# Patient Record
Sex: Female | Born: 1970 | Race: Black or African American | Hispanic: No | Marital: Married | Smoking: Former smoker
Health system: Southern US, Community
[De-identification: ages and names within clinical notes are randomized; demographics above are authoritative.]

## PROBLEM LIST (undated history)

## (undated) DIAGNOSIS — K219 Gastro-esophageal reflux disease without esophagitis: Secondary | ICD-10-CM

## (undated) DIAGNOSIS — K5900 Constipation, unspecified: Secondary | ICD-10-CM

## (undated) DIAGNOSIS — D649 Anemia, unspecified: Secondary | ICD-10-CM

## (undated) DIAGNOSIS — F419 Anxiety disorder, unspecified: Secondary | ICD-10-CM

## (undated) DIAGNOSIS — T7840XA Allergy, unspecified, initial encounter: Secondary | ICD-10-CM

## (undated) DIAGNOSIS — M1990 Unspecified osteoarthritis, unspecified site: Secondary | ICD-10-CM

## (undated) DIAGNOSIS — I10 Essential (primary) hypertension: Secondary | ICD-10-CM

## (undated) HISTORY — DX: Anemia, unspecified: D64.9

## (undated) HISTORY — DX: Constipation, unspecified: K59.00

## (undated) HISTORY — PX: WISDOM TOOTH EXTRACTION: SHX21

## (undated) HISTORY — DX: Gastro-esophageal reflux disease without esophagitis: K21.9

---

## 1997-07-22 ENCOUNTER — Emergency Department (HOSPITAL_COMMUNITY): Admission: EM | Admit: 1997-07-22 | Discharge: 1997-07-22 | Payer: Self-pay | Admitting: Emergency Medicine

## 1999-01-14 ENCOUNTER — Other Ambulatory Visit: Admission: RE | Admit: 1999-01-14 | Discharge: 1999-01-14 | Payer: Self-pay | Admitting: Obstetrics and Gynecology

## 1999-04-03 ENCOUNTER — Emergency Department (HOSPITAL_COMMUNITY): Admission: EM | Admit: 1999-04-03 | Discharge: 1999-04-03 | Payer: Self-pay | Admitting: Emergency Medicine

## 1999-10-11 ENCOUNTER — Inpatient Hospital Stay (HOSPITAL_COMMUNITY): Admission: AD | Admit: 1999-10-11 | Discharge: 1999-10-11 | Payer: Self-pay | Admitting: Obstetrics and Gynecology

## 1999-11-29 ENCOUNTER — Emergency Department (HOSPITAL_COMMUNITY): Admission: EM | Admit: 1999-11-29 | Discharge: 1999-11-29 | Payer: Self-pay | Admitting: Emergency Medicine

## 2000-04-09 ENCOUNTER — Inpatient Hospital Stay (HOSPITAL_COMMUNITY): Admission: AD | Admit: 2000-04-09 | Discharge: 2000-04-09 | Payer: Self-pay | Admitting: Obstetrics and Gynecology

## 2000-04-09 ENCOUNTER — Encounter: Payer: Self-pay | Admitting: Obstetrics and Gynecology

## 2000-04-27 ENCOUNTER — Encounter (INDEPENDENT_AMBULATORY_CARE_PROVIDER_SITE_OTHER): Payer: Self-pay

## 2000-04-27 ENCOUNTER — Ambulatory Visit (HOSPITAL_COMMUNITY): Admission: RE | Admit: 2000-04-27 | Discharge: 2000-04-27 | Payer: Self-pay | Admitting: Obstetrics and Gynecology

## 2000-07-05 ENCOUNTER — Emergency Department (HOSPITAL_COMMUNITY): Admission: EM | Admit: 2000-07-05 | Discharge: 2000-07-05 | Payer: Self-pay | Admitting: Emergency Medicine

## 2000-07-05 ENCOUNTER — Encounter: Payer: Self-pay | Admitting: Emergency Medicine

## 2000-07-10 ENCOUNTER — Emergency Department (HOSPITAL_COMMUNITY): Admission: EM | Admit: 2000-07-10 | Discharge: 2000-07-10 | Payer: Self-pay | Admitting: Emergency Medicine

## 2000-08-10 ENCOUNTER — Ambulatory Visit (HOSPITAL_COMMUNITY): Admission: RE | Admit: 2000-08-10 | Discharge: 2000-08-10 | Payer: Self-pay | Admitting: Obstetrics and Gynecology

## 2000-08-10 ENCOUNTER — Encounter (INDEPENDENT_AMBULATORY_CARE_PROVIDER_SITE_OTHER): Payer: Self-pay | Admitting: Specialist

## 2000-08-10 ENCOUNTER — Encounter: Payer: Self-pay | Admitting: Obstetrics and Gynecology

## 2001-06-04 ENCOUNTER — Other Ambulatory Visit: Admission: RE | Admit: 2001-06-04 | Discharge: 2001-06-04 | Payer: Self-pay | Admitting: Obstetrics and Gynecology

## 2001-06-25 ENCOUNTER — Observation Stay (HOSPITAL_COMMUNITY): Admission: AD | Admit: 2001-06-25 | Discharge: 2001-06-25 | Payer: Self-pay | Admitting: Obstetrics and Gynecology

## 2001-07-31 ENCOUNTER — Inpatient Hospital Stay (HOSPITAL_COMMUNITY): Admission: AD | Admit: 2001-07-31 | Discharge: 2001-07-31 | Payer: Self-pay | Admitting: Obstetrics and Gynecology

## 2001-08-01 ENCOUNTER — Ambulatory Visit (HOSPITAL_COMMUNITY): Admission: RE | Admit: 2001-08-01 | Discharge: 2001-08-01 | Payer: Self-pay | Admitting: Obstetrics and Gynecology

## 2001-08-01 ENCOUNTER — Encounter: Payer: Self-pay | Admitting: Obstetrics and Gynecology

## 2001-08-12 ENCOUNTER — Inpatient Hospital Stay (HOSPITAL_COMMUNITY): Admission: AD | Admit: 2001-08-12 | Discharge: 2001-08-15 | Payer: Self-pay | Admitting: Obstetrics and Gynecology

## 2001-08-13 ENCOUNTER — Encounter: Payer: Self-pay | Admitting: Obstetrics and Gynecology

## 2001-08-22 ENCOUNTER — Inpatient Hospital Stay (HOSPITAL_COMMUNITY): Admission: AD | Admit: 2001-08-22 | Discharge: 2001-08-22 | Payer: Self-pay | Admitting: Obstetrics and Gynecology

## 2001-10-20 ENCOUNTER — Encounter: Payer: Self-pay | Admitting: Obstetrics and Gynecology

## 2001-10-20 ENCOUNTER — Inpatient Hospital Stay (HOSPITAL_COMMUNITY): Admission: AD | Admit: 2001-10-20 | Discharge: 2001-10-20 | Payer: Self-pay | Admitting: Obstetrics and Gynecology

## 2001-11-09 ENCOUNTER — Inpatient Hospital Stay (HOSPITAL_COMMUNITY): Admission: AD | Admit: 2001-11-09 | Discharge: 2001-11-09 | Payer: Self-pay | Admitting: Obstetrics and Gynecology

## 2001-12-12 ENCOUNTER — Observation Stay (HOSPITAL_COMMUNITY): Admission: AD | Admit: 2001-12-12 | Discharge: 2001-12-13 | Payer: Self-pay | Admitting: Obstetrics and Gynecology

## 2001-12-12 ENCOUNTER — Encounter: Payer: Self-pay | Admitting: Obstetrics and Gynecology

## 2001-12-14 ENCOUNTER — Inpatient Hospital Stay (HOSPITAL_COMMUNITY): Admission: AD | Admit: 2001-12-14 | Discharge: 2001-12-14 | Payer: Self-pay | Admitting: Obstetrics and Gynecology

## 2001-12-23 ENCOUNTER — Inpatient Hospital Stay (HOSPITAL_COMMUNITY): Admission: AD | Admit: 2001-12-23 | Discharge: 2001-12-25 | Payer: Self-pay | Admitting: Obstetrics and Gynecology

## 2002-03-28 ENCOUNTER — Emergency Department (HOSPITAL_COMMUNITY): Admission: EM | Admit: 2002-03-28 | Discharge: 2002-03-28 | Payer: Self-pay | Admitting: Emergency Medicine

## 2002-05-28 ENCOUNTER — Inpatient Hospital Stay (HOSPITAL_COMMUNITY): Admission: AD | Admit: 2002-05-28 | Discharge: 2002-05-28 | Payer: Self-pay | Admitting: Obstetrics and Gynecology

## 2002-10-15 ENCOUNTER — Encounter: Payer: Self-pay | Admitting: Emergency Medicine

## 2002-10-15 ENCOUNTER — Emergency Department (HOSPITAL_COMMUNITY): Admission: EM | Admit: 2002-10-15 | Discharge: 2002-10-15 | Payer: Self-pay | Admitting: Emergency Medicine

## 2003-12-06 ENCOUNTER — Emergency Department (HOSPITAL_COMMUNITY): Admission: EM | Admit: 2003-12-06 | Discharge: 2003-12-07 | Payer: Self-pay | Admitting: Emergency Medicine

## 2004-03-23 ENCOUNTER — Emergency Department (HOSPITAL_COMMUNITY): Admission: EM | Admit: 2004-03-23 | Discharge: 2004-03-23 | Payer: Self-pay | Admitting: Emergency Medicine

## 2004-05-18 ENCOUNTER — Inpatient Hospital Stay (HOSPITAL_COMMUNITY): Admission: AD | Admit: 2004-05-18 | Discharge: 2004-05-18 | Payer: Self-pay | Admitting: *Deleted

## 2004-06-16 ENCOUNTER — Ambulatory Visit: Payer: Self-pay | Admitting: Family Medicine

## 2004-06-27 ENCOUNTER — Ambulatory Visit (HOSPITAL_COMMUNITY): Admission: RE | Admit: 2004-06-27 | Discharge: 2004-06-27 | Payer: Self-pay | Admitting: *Deleted

## 2004-07-14 ENCOUNTER — Ambulatory Visit: Payer: Self-pay | Admitting: Family Medicine

## 2004-08-01 ENCOUNTER — Ambulatory Visit: Payer: Self-pay | Admitting: Obstetrics and Gynecology

## 2005-05-13 ENCOUNTER — Emergency Department (HOSPITAL_COMMUNITY): Admission: EM | Admit: 2005-05-13 | Discharge: 2005-05-13 | Payer: Self-pay | Admitting: Emergency Medicine

## 2005-12-21 ENCOUNTER — Emergency Department (HOSPITAL_COMMUNITY): Admission: EM | Admit: 2005-12-21 | Discharge: 2005-12-21 | Payer: Self-pay | Admitting: Emergency Medicine

## 2008-04-08 ENCOUNTER — Emergency Department (HOSPITAL_COMMUNITY): Admission: EM | Admit: 2008-04-08 | Discharge: 2008-04-08 | Payer: Self-pay | Admitting: Emergency Medicine

## 2008-09-15 ENCOUNTER — Emergency Department (HOSPITAL_COMMUNITY): Admission: EM | Admit: 2008-09-15 | Discharge: 2008-09-15 | Payer: Self-pay | Admitting: Emergency Medicine

## 2009-04-05 ENCOUNTER — Encounter (INDEPENDENT_AMBULATORY_CARE_PROVIDER_SITE_OTHER): Payer: Self-pay | Admitting: *Deleted

## 2009-04-19 ENCOUNTER — Ambulatory Visit: Payer: Self-pay | Admitting: Family Medicine

## 2009-04-19 DIAGNOSIS — J309 Allergic rhinitis, unspecified: Secondary | ICD-10-CM | POA: Insufficient documentation

## 2009-04-19 DIAGNOSIS — M549 Dorsalgia, unspecified: Secondary | ICD-10-CM | POA: Insufficient documentation

## 2009-04-19 DIAGNOSIS — D509 Iron deficiency anemia, unspecified: Secondary | ICD-10-CM

## 2009-04-19 DIAGNOSIS — N951 Menopausal and female climacteric states: Secondary | ICD-10-CM

## 2009-04-19 DIAGNOSIS — M25539 Pain in unspecified wrist: Secondary | ICD-10-CM

## 2009-04-19 DIAGNOSIS — K219 Gastro-esophageal reflux disease without esophagitis: Secondary | ICD-10-CM | POA: Insufficient documentation

## 2009-04-19 DIAGNOSIS — M25569 Pain in unspecified knee: Secondary | ICD-10-CM

## 2009-04-20 ENCOUNTER — Ambulatory Visit: Payer: Self-pay | Admitting: Family Medicine

## 2009-04-20 LAB — CONVERTED CEMR LAB
AST: 24 units/L (ref 0–37)
Alkaline Phosphatase: 51 units/L (ref 39–117)
Basophils Absolute: 0 10*3/uL (ref 0.0–0.1)
Bilirubin, Direct: 0.1 mg/dL (ref 0.0–0.3)
CO2: 30 meq/L (ref 19–32)
Calcium: 9.4 mg/dL (ref 8.4–10.5)
Creatinine, Ser: 0.8 mg/dL (ref 0.4–1.2)
Eosinophils Absolute: 0.1 10*3/uL (ref 0.0–0.7)
GFR calc non Af Amer: 102.85 mL/min (ref >60)
HDL: 95.3 mg/dL (ref >39.00)
LH: 43.01 milliintl units/mL
Lymphocytes Relative: 27.8 % (ref 12.0–46.0)
MCHC: 33.9 g/dL (ref 30.0–36.0)
Monocytes Relative: 5.9 % (ref 3.0–12.0)
Neutrophils Relative %: 65.1 % (ref 43.0–77.0)
RBC: 4.77 M/uL (ref 3.87–5.11)
RDW: 14.4 % (ref 11.5–14.6)
Sodium: 145 meq/L (ref 135–145)
Total CHOL/HDL Ratio: 2
Triglycerides: 34 mg/dL (ref 0.0–149.0)
VLDL: 6.8 mg/dL (ref 0.0–40.0)

## 2009-04-21 ENCOUNTER — Telehealth (INDEPENDENT_AMBULATORY_CARE_PROVIDER_SITE_OTHER): Payer: Self-pay | Admitting: *Deleted

## 2009-04-23 ENCOUNTER — Encounter: Payer: Self-pay | Admitting: Family Medicine

## 2010-02-08 NOTE — Progress Notes (Signed)
Summary: lab  Phone Note Outgoing Call   Call placed by: Doristine Devoid,  April 21, 2009 2:12 PM Call placed to: Patient Summary of Call: level is very low.  needs 50,000 units weekly x12 weeks and then repeat level.  no evidence of something obstructing spinal canal or movement.  normal xray  Follow-up for Phone Call        unable to leave msg will try later.......Marland KitchenDoristine Devoid  April 21, 2009 2:26 PM   left message on machine ...Marland KitchenMarland KitchenDoristine Devoid  April 29, 2009 10:37 AM   spoke w/ patient aware of labs and start of medication........Marland KitchenDoristine Devoid  May 04, 2009 3:01 PM     New/Updated Medications: VITAMIN D (ERGOCALCIFEROL) 50000 UNIT CAPS (ERGOCALCIFEROL) take one tablet weekly Prescriptions: VITAMIN D (ERGOCALCIFEROL) 50000 UNIT CAPS (ERGOCALCIFEROL) take one tablet weekly  #12 x 0   Entered by:   Doristine Devoid   Authorized by:   Neena Rhymes MD   Signed by:   Doristine Devoid on 05/04/2009   Method used:   Electronically to        [facility] [address] (retail)       [phone]       [place], [place]  [address]       Ph: [phone]       Fax: [phone]   RxID:   [id number]

## 2010-02-08 NOTE — Letter (Signed)
Summary: Unable to Reach Patient/[facility]  Unable to Reach Patient/[facility]   Imported By: Lanelle Bal 05/11/2009 11:30:48  _____________________________________________________________________  External Attachment:    Type:   Image     Comment:   External Document

## 2010-02-08 NOTE — Letter (Signed)
Summary: New Patient Letter  [facility]  [address] [place], [place] [address]   Phone: [phone]  Fax: [phone]       04/05/2009 MRN: [id number]  Rebekah [name] [address] [place], [place]  [address]  Dear [name],   Welcome to [facility] and thank you for choosing [place] as your Primary Care Providers. Enclosed you will find information about our practice that we hope you find helpful. We have also enclosed forms to be filled out prior to your visit. This will provide [place] with the necessary information and facilitate your being seen in a timely manner. If you have any questions, please call us at:  [phone]        and we will be happy to assist you. We look forward to seeing you at your scheduled appointment time.  Appointment   [name] 54,[phone] @ 10:00AM            with Dr.    Beverely Low             Sincerely,  Primary Health Care Team  Please arrive 15 minutes early for your first appointment and bring your insurance card. Co-pay is required at the time of your visit.  *****Please call the office if you are not able to keep this appointment. There is a charge of $50.00 if any appointment is not cancelled or rescheduled within 24 hours.

## 2010-02-08 NOTE — Assessment & Plan Note (Signed)
Summary: new pt/uhc/ns/CPX/KDC   Vital Signs:  Patient profile:   40 year old female Height:      67.50 inches Weight:      157 pounds BMI:     24.31 Pulse rate:   66 / minute BP sitting:   120 / 70  (left arm)  Vitals Entered By: Doristine Devoid (April 19, 2009 9:52 AM) CC: new est- menopause, R wrist pain   History of Present Illness: 40 yo woman here today to establish care.  has not seen a doctor since 2007 and no PCP for 6 years.  Previous MD- Stefano Gaul.  pt is fasting today.  1) R wrist pain- pt fell last year and broke her fall with her R wrist.  wrist is now painful and swells.  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  fingers now tingling and pain will radiate up past elbow.  not taking any OTC meds for pain.  2) back pain- pt stands all day at her job.  experiencing band like pain across the low back.  no radiation or pain.  worse w/ standing, improves w/ sitting.  notes stiffness and trouble changing position.  wears supportive sneakers while standing.  using heat w/ minimal relief.  3) ? menopause- pt had 2 cycles last year and only 1 period this year.  reports difficulty regulating temperature and hot flashes 'dripping w/ sweat'.  will occasionally wake up w/ bed soaked.  sxs started in January.  Preventive Screening-Counseling & Management  Alcohol-Tobacco     Alcohol drinks/day: <1     Smoking Status: current     Smoking Cessation Counseling: yes     Smoke Cessation Stage: contemplative     Packs/Day: 0.25  Caffeine-Diet-Exercise     Does Patient Exercise: no      Sexual History:  currently monogamous, married, and feels safe.        Drug Use:  never.    Current Medications (verified): 1)  None  Allergies (verified): 1)  ! 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 CV:  Denies palpitations. MS:  Complains of joint pain, joint swelling, loss of strength, and low back pain; denies joint redness and muscle weakness.  Physical Exam  General:  Well-developed,well-nourished,in no acute distress; alert,appropriate and cooperative throughout examination Neck:  No deformities, masses, or tenderness noted. Lungs:  Normal respiratory effort, chest expands symmetrically. Lungs are clear to auscultation, no crackles or wheezes. Heart:  Normal rate and regular rhythm. S1 and S2 normal without gallop, murmur, click, rub or other extra sounds. Msk:  no pain w/ back flexion, + pain w/ extension.  (-) SLR bilaterally.  minimal pain w/ lateral bending.  R wrist pain on ulnar side, pain w/ suppination, pain w/ extension>flexion  no joint line tenderness of knees bilaterally, no edema, warmth.  full ROM w/ minimal crepitus Pulses:  +2 radial, ulnar, DP, PT Extremities:  No clubbing, cyanosis, edema, or deformity noted with normal full range of motion of all joints.   Neurologic:  strength normal in all extremities, sensation  intact to light touch, gait normal, and DTRs symmetrical and normal.     Impression & Recommendations:  Problem # 1:  HOT FLASHES (ICD-627.2) Assessment New pt's hot flashes and irregular periods concerning for premature ovarian failure.  check labs.  discussed hormonal options (pt a current smoker) SSRI, and black cohosh.  pt not willing to try SSRI at this time.  will continue to follow. Orders: Venipuncture (16109) TLB-TSH (Thyroid Stimulating Hormone) (84443-TSH) TLB-Luteinizing Hormone (LH) (83002-LH) TLB-FSH (Follicle Stimulating  Hormone) (83001-FSH)  Problem # 2:  BACK PAIN (ICD-724.5) Assessment: New pt's pain w/ extension and not flexion concerning for possible spinal stenosis, bulging disc posteriorly.  check Xrays.  start pain meds, NSAIDs, and muscle relaxer.  reviewed supportive care and red flags that should prompt return.  Pt expresses understanding and is in agreement w/ this plan. Orders: T-Lumbar Spine w/Flex & Ext 4 Views (60454UJ)  Her updated medication list for this problem includes:    Vicodin 5-500 Mg Tabs (Hydrocodone-acetaminophen) .Marland Kitchen... 1 tab q4-6 as needed for severe pain.  will cause drowsiness    Cyclobenzaprine Hcl 10 Mg Tabs (Cyclobenzaprine hcl) .Marland Kitchen... 1 by mouth 2 times daily as needed for back pain.  will cause drowsiness  Problem # 3:  KNEE PAIN, BILATERAL (ICD-719.46) Assessment: New pt's knee pain likely due to prolonged standing.  suggested neoprene sleeves for support.  pain is mild compared to back and wrist.  meds for back/wrist pain will also tx knees. Her updated medication list for this problem includes:    Vicodin 5-500 Mg Tabs (Hydrocodone-acetaminophen) .Marland Kitchen... 1 tab q4-6 as needed for severe pain.  will cause drowsiness    Cyclobenzaprine Hcl 10 Mg Tabs (Cyclobenzaprine hcl) .Marland Kitchen... 1 by mouth 2 times daily as needed for back pain.  will cause drowsiness  Problem # 4:  PHYSICAL EXAMINATION (ICD-V70.0)  labs collected  Orders: T-Vitamin D (25-Hydroxy) ([id number]) TLB-Lipid Panel (80061-LIPID) TLB-BMP (Basic Metabolic Panel-BMET) (80048-METABOL) TLB-CBC Platelet - w/Differential (85025-CBCD) TLB-Hepatic/Liver Function Pnl (80076-HEPATIC)  Problem # 5:  WRIST PAIN, RIGHT, CHRONIC (ICD-719.43) Assessment: New  pt's pain has progressively worsened since sustaining fall last year.  given fact pt is R handed and her job depends on using hand will refer to hand specialist.  Orders: Orthopedic Referral (Ortho)  Complete Medication List: 1)  Vicodin 5-500 Mg Tabs  (Hydrocodone-acetaminophen) .Marland Kitchen.. 1 tab q4-6 as needed for severe pain.  will cause drowsiness 2)  Cyclobenzaprine Hcl 10 Mg Tabs (Cyclobenzaprine hcl) .Marland Kitchen.. 1 by mouth 2 times daily as needed for back pain.  will cause drowsiness  Patient Instructions: 1)  Please schedule a complete physical in the next 2-4 weeks 2)  Go to [address] to get your back xray 3)  Someone will call you with your hand appt 4)  Make sure you are wearing supportive shoes 5)  Wear a knee sleeve as needed for pain 6)  Take the Vicodin as needed for severe pain- do not drive 7)  Use the Cyclobenzaprine for back pain before bed- will cause drowsiness 8)  Ice your wrist and knees after work 9)  Use heat on your back 10)  Take Ibuprofen or Aleve for the inflammation 11)  Start Black Cohosh for your hot flashes 12)  Hang in there!!! Prescriptions: CYCLOBENZAPRINE HCL 10 MG  TABS (CYCLOBENZAPRINE HCL) 1 by mouth 2 times daily as needed for back pain.  will cause drowsiness  #20 x 0   Entered and Authorized by:   Neena Rhymes MD  Signed by:   Neena Rhymes MD on 04/19/2009   Method used:   Print then Give to Patient   RxID:   [id number] VICODIN 5-500 MG TABS (HYDROCODONE-ACETAMINOPHEN) 1 tab Q4-6 as needed for severe pain.  will cause drowsiness  #30 x 0   Entered and Authorized by:   Neena Rhymes MD   Signed by:   Neena Rhymes MD on 04/19/2009   Method used:   Print then Give to Patient   RxID:   [id number]

## 2010-04-15 LAB — POCT I-STAT, CHEM 8
Chloride: 106 mEq/L (ref 96–112)
Glucose, Bld: 99 mg/dL (ref 70–99)
HCT: 45 % (ref 36.0–46.0)
Potassium: 4 mEq/L (ref 3.5–5.1)

## 2010-04-15 LAB — URINE MICROSCOPIC-ADD ON

## 2010-04-15 LAB — DIFFERENTIAL
Basophils Relative: 0 % (ref 0–1)
Lymphocytes Relative: 25 % (ref 12–46)
Lymphs Abs: 1.4 10*3/uL (ref 0.7–4.0)
Monocytes Relative: 6 % (ref 3–12)
Neutro Abs: 3.9 10*3/uL (ref 1.7–7.7)
Neutrophils Relative %: 69 % (ref 43–77)

## 2010-04-15 LAB — URINALYSIS, ROUTINE W REFLEX MICROSCOPIC
Glucose, UA: NEGATIVE mg/dL
Specific Gravity, Urine: 1.012 (ref 1.005–1.030)
pH: 7.5 (ref 5.0–8.0)

## 2010-04-15 LAB — WET PREP, GENITAL
Trich, Wet Prep: NONE SEEN
Yeast Wet Prep HPF POC: NONE SEEN

## 2010-04-15 LAB — CBC
MCHC: 33.6 g/dL (ref 30.0–36.0)
RBC: 4.78 MIL/uL (ref 3.87–5.11)
WBC: 5.6 10*3/uL (ref 4.0–10.5)

## 2010-04-21 LAB — COMPREHENSIVE METABOLIC PANEL
Alkaline Phosphatase: 75 U/L (ref 39–117)
BUN: 4 mg/dL — ABNORMAL LOW (ref 6–23)
Chloride: 106 mEq/L (ref 96–112)
Glucose, Bld: 100 mg/dL — ABNORMAL HIGH (ref 70–99)
Potassium: 3.7 mEq/L (ref 3.5–5.1)
Total Bilirubin: 0.7 mg/dL (ref 0.3–1.2)

## 2010-04-21 LAB — DIFFERENTIAL
Basophils Absolute: 0 10*3/uL (ref 0.0–0.1)
Basophils Relative: 0 % (ref 0–1)
Eosinophils Absolute: 0 10*3/uL (ref 0.0–0.7)
Monocytes Absolute: 0.4 10*3/uL (ref 0.1–1.0)
Neutro Abs: 3.8 10*3/uL (ref 1.7–7.7)
Neutrophils Relative %: 75 % (ref 43–77)

## 2010-04-21 LAB — CBC
HCT: 46 % (ref 36.0–46.0)
Hemoglobin: 15.3 g/dL — ABNORMAL HIGH (ref 12.0–15.0)
RBC: 5.47 MIL/uL — ABNORMAL HIGH (ref 3.87–5.11)
WBC: 5.1 10*3/uL (ref 4.0–10.5)

## 2010-04-21 LAB — URINALYSIS, ROUTINE W REFLEX MICROSCOPIC
Glucose, UA: NEGATIVE mg/dL
Ketones, ur: NEGATIVE mg/dL
Leukocytes, UA: NEGATIVE
Protein, ur: NEGATIVE mg/dL

## 2010-04-21 LAB — URINE MICROSCOPIC-ADD ON

## 2010-05-27 NOTE — Op Note (Signed)
[facility]  Patient:    Rebekah [name], Rebekah [name]               MRN: [id number] Proc. Date: 08/10/00 Adm. Date:  [id number] Disc. Date: [id number] Attending:  Leonard Schwartz                           Operative Report  PREOPERATIVE DIAGNOSES:       1. Threatened abortion.                               2. Probable left ectopic pregnancy.  POSTOPERATIVE DIAGNOSES:      1. Threatened abortion.                               2. Probable left ectopic pregnancy.  OPERATION:                    Suction, dilatation and curettage.  SURGEON:                      Janine Limbo, M.D.  ANESTHESIA:                   IV sedation and paracervical block.  DISPOSITION:                  [name] is a 40 year old female, gravida 4, para 1-0-2-1, who presents with abnormal quantitative HCG values and with an ultrasound that showed no intrauterine gestation but a suspicious left adnexal lesion.  The patient understands the indications for her procedure, and she accepts the risks of, but not limited to, anesthetic complications, bleeding, infections, and possible damage to surrounding organs.  FINDINGS:                     The patients blood type is 0 positive.  Minimal tissue was removed from within the uterine cavity.  DESCRIPTION OF PROCEDURE:     The patient was taken to the operating room where she was given medication through her IV line.  The perineum and vagina were prepped with multiple layers of Betadine.  The bladder was drained of urine.  Examination under anesthesia was performed.  The patient was sterilely draped.  A paracervical block was placed using 10 cc of 0.5% Marcaine without epinephrine.  The uterus was sounded to 8 cm.   The cervix was gradually dilated.  The uterine cavity was evacuated using a #8 suction curette and then a medium sharp curette. The cavity was felt to be clean at the end of our procedure.  Hemostasis was  adequate.  Minimal tissue was obtained.  All instruments were removed.  The patient was taken to the recovery after her anesthetic was reversed.  She was noted to be in stable condition.  The estimated blood loss for the procedure was less than 10 cc.  FOLLOWUP INSTRUCTIONS:  The patient will receive methotrexate in the recovery room, and she will receive the methotrexate protocol.  She will follow up for repeat HCG values, and she will call for any questions or concerns.  She will use ibuprofen for pain management. DD:  08/10/00 TD:  08/13/00 Job: [id number]

## 2010-05-27 NOTE — H&P (Signed)
[facility]  Patient:    Rebekah [name], Rebekah [name]               MRN: [id number] Adm. Date:  [id number] Disc. Date: [id number] Attending:  Leonard Schwartz                         History and Physical  HISTORY OF PRESENT ILLNESS:  Rebekah [name] is a 40 year old female, gravida 4, para 1-0-2-1, who presents with abnormally rising quantitative beta hCG values. She has had an ultrasound performed that did not document an intrauterine gestation. The patient presents again today for followup ultrasound. An ultrasound at the [facility] showed no intrauterine gestation but a 1.3 cm cystic lesion with an echogenic ring in the left adnexa that was separate from the left ovary. This was felt to be highly suspicious for an ectopic pregnancy.  PAST MEDICAL HISTORY:         The patient had a D&C in April of 2002 because of a miscarriage. She denies a history of prior sexually transmitted infections.  CURRENT MEDICATIONS:          Robitussin and Sudafed that she uses on an as-needed basis.  DRUG ALLERGIES:               The patient denies drug allergies.  OBSTETRICAL HISTORY:          The patient has had one term vaginal delivery and two miscarriages.  REVIEW OF SYSTEMS:            Noncontributory.  SOCIAL HISTORY:               The patient smokes cigarettes and she does drink alcohol.  FAMILY HISTORY:               Noncontributory.  PHYSICAL EXAMINATION:  VITAL SIGNS:                  Blood pressure 117/76, temperature 99, pulse 94, respirations 22.  HEENT:                        Within normal limits.  CHEST:                        Clear.  HEART:                        Regular rate and rhythm.  ABDOMEN:                      Nontender.  EXTREMITIES:                  Within normal limits.  NEUROLOGIC:                   Grossly normal.  PELVIC:                       External genitalia is normal. Vagina is normal. Cervix is nontender.  Uterus is upper limits normal size and nontender. Adnexa: no masses are appreciated.  LABORATORY VALUES:            HCG today is 4690, hemoglobin is 12.6, hematocrit is 36.8, white blood cell count is 7500, platelet count is 230,000. Blood type is O positive. BUN is 4, creatinine is 0.8, SGOT is 28.  ASSESSMENT:  Probable left ectopic pregnancy.  PLAN:                         A long discussion was held with the patient about her medical and surgical options. She has decided to proceed with dilatation and curettage and then proceed with methotrexate therapy unless there is any compelling evidence that there is an intrauterine gestation. DD:  08/10/00 TD:  08/12/00 Job: [id number]

## 2010-05-27 NOTE — H&P (Signed)
[facility]  Patient:    Rebekah [name]                MRN: [id number] Adm. Date:  [id number] Disc. Date: [id number] Attending:  Leonard Schwartz                         History and Physical  HISTORY OF PRESENT ILLNESS:   Rebekah [name] is a 40 year old female, gravida 3, para 1-0-1-1, who presents with a first trimester missed abortion based on abnormal quantitative HCG values and a nonviable gestation documented by ultrasound.  OB/GYN HISTORY:               The patient had a term vaginal delivery in 1996. She had a miscarriage in 2001.  ALLERGIES:                    No known drug allergies.  PAST MEDICAL HISTORY:         The patient denies hypertension and diabetes.  SOCIAL HISTORY:               The patient denies cigarette use, alcohol use and recreational drug use.  REVIEW OF SYSTEMS:            Noncontributory.  FAMILY HISTORY:               The patients father has chronic hypertension. The patients mother had throat cancer.  PHYSICAL EXAMINATION:  VITAL SIGNS:                  Weight is 136 pounds.  HEENT:                        Within normal limits.  LUNGS:                        Chest is clear.  CARDIOVASCULAR:               Regular rate and rhythm.  BREASTS:                      Without masses.  ABDOMEN:                      Nontender.  EXTREMITIES:                  Within normal limits.  NEUROLOGIC:                   Grossly normal exam.  PELVIC:                       External genitalia is normal. Vaginal is normal. Cervix is normal. Uterus is normal size, shape and consistency. Adnexa no masses.  LABORATORY AND ACCESSORY DATA: Blood type is O positive.  ASSESSMENT:                   First trimester missed abortion.  PLAN:                         The patient will undergo a suction dilatation and evacuation. She understands the indications for her procedure and she accepts the risks of, but not limited to  anesthetic complications, bleeding, infections, and possible damage to the surrounding organs. DD:  04/26/00 TD:  04/26/00 Job: [id number]  [id number]

## 2010-05-27 NOTE — Group Therapy Note (Signed)
[name], Rebekah NO.:  [id number]   MEDICAL RECORD NO.:  [id number]          PATIENT TYPE:  WOC   LOCATION:  [facility]                   FACILITY:  [facility]   PHYSICIAN:  Tinnie Gens, MD        DATE OF BIRTH:  [birth year]   DATE OF SERVICE:  07/14/2004                                    CLINIC NOTE   CHIEF COMPLAINT:  Follow-up.   HISTORY OF PRESENT ILLNESS:  Patient is a 40 year old gravida 4, para 2-0-2-  2 who previously was seen for ovarian cysts and pain.  Patient states that  really her period is what is bothering her, the cramping and bleeding  related to her period.  She has previously been on Depo.  She has been off  for the last year, but over the last several months has noticed more  irregularity and pain with her periods.  The patient's last ultrasound  showed no hydrosalpinx, two very small less than 1 cm mural fibroids, and  resolution of her ovarian cysts.  Patient has no significant evidence of PID  and I have a feeling that most of her pain symptoms related to normal cycles  where she has been on Depo for a long time that that has controlled her  bleeding as well as her pain related to normal cycles.  Patient __________  getting back on her cycle.  Her last period was on June 24.  She has had  unprotected sex since then.   PHYSICAL EXAMINATION:  VITAL SIGNS:  As noted in the chart.  GENERAL:  She is a well-developed, well-nourished black female in no acute  distress.  ABDOMEN:  Soft, nontender, nondistended.   IMPRESSION:  1.  Dysmenorrhea.  2.  Menometrorrhagia.   PLAN:  1.  Restart the patient's Depo Provera with her next cycle.  2.  Flexeril 10 mg one p.o. t.i.d. to be taken in conjunction with Naprosyn      or ibuprofen, can be over-the-counter medications for pain with her      periods.  Will follow up this patient in three months, see how the Depo      is doing in control of her symptoms as well as give her her next  shot.       TP/MEDQ  D:  07/14/2004  T:  07/14/2004  Job:  [id number]

## 2010-05-27 NOTE — Discharge Summary (Signed)
Rebekah [name], Rebekah [name]                  ACCOUNT NO.:  [id number]   MEDICAL RECORD NO.:  [id number]                   PATIENT TYPE:  INP   LOCATION:  9101                                 FACILITY:  [facility]   PHYSICIAN:  Crist Fat. Rivard, M.D.              DATE OF BIRTH:  [birth year]   DATE OF ADMISSION:  08/12/2001  DATE OF DISCHARGE:  08/15/2001                                 DISCHARGE SUMMARY   ADMISSION DIAGNOSES:  1. Intrauterine pregnancy at 19-6/7 weeks.  2. Persistent nausea and vomiting.  3. Fever of unknown origin.  4. Kidney stone versus pyelo.   DISCHARGE DIAGNOSES:  1. Intrauterine pregnancy at 20-2/7 weeks.  2. Probable upper respiratory infection.  3. Right hip and pelvic bone pain.   PROCEDURE:  None.   HOSPITAL COURSE:  The patient is a 40 year old gravida 4, para 1-0-2-1 at 20  weeks who presented by EMS on August 12, 2001 with malaise, sore throat,  sudden onset of severe right flank pain. She also had a sinus infection. She  was vomiting several times a day and has been unresponsive to Phenergan,  Reglan, Zofran, prednisone and has discontinued Reglan pump on July 08, 2001. Pregnancy was remarkable for history of hyperemesis with the entire  previous pregnancies.   On admission the patient was afebrile. Fetal heart rate was normal. She was  having positive right CVA tenderness. Cervix was long and closed. Labs were  done with TSH, comprehensive metabolic and a CMET, abdominal and pelvic CT  scan. She did spike a temperature of 100.7 later that afternoon. WBC count  was 10.7. She had 30 of protein and negative ketones on UA.   Decision was made to consider imaging studies but the patient declined a CT  with contrast. She did have a gallbladder and renal ultrasounds that were  normal. She was placed on IV Ancef as she was unable to tolerate p.o.  medications for her sinusitis. She was placed on Stadol for pain management  which helped her but then began  to have severe sore throat on August 13, 2001.   TSH was normal. Her throat pain worsened through the 6th. She also began to  run fevers at night with a max of 101.8 on August 13, 2001. Infectious  Disease consult was accomplished. They recommended changing to Unasyn. Her  rapid Strep culture was negative for the throat. Epstein-Barr virus, CMV  titers and blood  cultures were done. A GC culture was also done on the  throat. She was changed to Unasyn on the evening of August 6. She was  afebrile through the morning of August 7.   Dr. Cliffton Asters, Infectious Disease, consulted with the patient on August  7. He felt that she would be under good control with an upper respiratory  infection with Augmentin. He recommended Afrin if that was not  contraindicated in pregnancy. Decision was made to hold off on the Afrin and  use Clarinex per Dr. Lilian Coma recommendation.   The patient by August 15, 2001 was feeling much better. She was tolerating  p.o. intake and eating well. She was having no significant nausea and was  able to tolerate p.o. pain medications as well. Her pain was minimal and her  throat was much improved. She was deemed  to have received full benefit of  her hospital stay and was discharged home.   DISCHARGE INSTRUCTIONS:  The patient  is to be on increased rest this week.  She is to take her temperature if she begins to have fever or chills and to  call if she has a temperature greater than or equal to 100.4 or any other  negative signs and symptoms.   DISCHARGE MEDICATIONS:  1. Clarinex 5 mg one p.o. q.d.  2. Augmentin 875 mg one p.o. b.i.d.  times seven days.  3. Darvocet-N 100 one to two p.o. q.4-6h. p.r.n. pain.  4. Over-the-counter Motrin 600 mg q.6h. p.r.n.  5. Afrin will be held at present if Clarinex seems to work.   Discharge follow up will occur on August 21, 2001 at __________ [facility] with  Nigel Bridgeman for follow up  and then as per her previously scheduled   appointments later this month.      Rebekah [name], C.N.M.                   Crist Fat Rivard, M.D.    Leeanne Mannan  D:  08/15/2001  T:  08/20/2001  Job:  [id number]

## 2010-05-27 NOTE — Group Therapy Note (Signed)
Rebekah [name], [name] NO.:  [id number]   MEDICAL RECORD NO.:  [id number]          PATIENT TYPE:  WOC   LOCATION:  [facility]                   FACILITY:  [facility]   PHYSICIAN:  Tinnie Gens, MD        DATE OF BIRTH:  1970-12-23   DATE OF SERVICE:  06/16/2004                                    CLINIC NOTE   CHIEF COMPLAINT:  Follow-up MAU.   HISTORY OF PRESENT ILLNESS:  The patient is a 40 year old gravida 4 para 2-0-  2-2 who was seen in the MAU for PID, cysts, and she needs a Pap smear. The  patient has a 51-year-old child. She is unsure about future fertility. The  patient reports that she has had pain for the last several months that seems  to getting worse. She has a lot of painful intercourse. Pelvic pain is worse  with her cycle. She notes a lot of frequent urination, nausea/vomiting as  well. She also states she thinks some of this may be related to stress in  her life. 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   PAST MEDICAL HISTORY:  Negative except for some anxiety disorder.   PAST SURGICAL HISTORY:  She had a D&C x2.   MEDICATIONS:  None.   ALLERGIES:  LATEX.   OBSTETRICAL HISTORY:  G4 P2, two miscarriages, two SVDs.   GYNECOLOGICAL HISTORY:  Menarche at age 57, cycles each month, last  approximately a week. Severe pain with her period. She has previously been  on Depo-Provera but is on nothing right now.   FAMILY HISTORY:  Hypertension and some type of cancer although she is not  sure what.   SOCIAL HISTORY:  She lives with her two daughters and her husband. She has  shared custody of her 80 year old with her former husband. She has a job  working Engineering geologist. 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 She does smoke  one-quarter pack per day for the last 10 years. She does have a beer  approximately every-other day. She has no drug history.   REVIEW OF SYSTEMS:   A 14-point review of systems is reviewed. Please see GYN  history on the chart. Is positive for fatigue, weight loss, headaches, dizzy  spells, nausea/vomiting, frequent urination, and painful intercourse.   PHYSICAL EXAMINATION:  VITAL SIGNS:  As noted in the chart. Her blood  pressure is 125/84.  GENERAL:  She is a well-developed, well-nourished black female in no acute  distress.  ABDOMEN:  Soft, moderately tender in the lower quadrants bilaterally.  GENITOURINARY:  She has normal external female genitalia. The vagina is pink  and rugated. The cervix is parous and without lesion. The uterus is small  and anteverted. The adnexa have bilateral tenderness. In fact, exam is  significantly limited by tenderness. The patient does have some vaginismus  as well as diffuse pelvic tenderness noted.   Ultrasound findings on May 10 show a cystic lesion on the right that looks  like it  comes from the ovary. It is 4.5 x 4.3 x 2.3. Contains single thin  septations. No mural nodules or solid component identified. Possibly  consistent with a physiologic cyst. The left adnexa shows a tubular  structure which is 5.0 x 1.2 which is probably consistent with a  hydrosalpinx. The patient does not have any history of PID.   IMPRESSION:  1.  Pelvic pain with evidence of a right ovarian cyst and possible left      hydrosalpinx.  2.  Annual examination with Pap.  3.  Anxiety.   PLAN:  1.  Repeat ultrasound in 2 weeks for follow-up of cyst.  2.  Pap smear today.  3.  Lexapro 10 mg one p.o. daily.  4.  Follow in 4 weeks to see what has resolved or not resolved on her      ultrasound and possible discussion about future fertility plus or minus      surgical options will be discussed.       TP/MEDQ  D:  06/16/2004  T:  06/16/2004  Job:  [id number]

## 2010-05-27 NOTE — H&P (Signed)
NAMEJEREMY, MCLAMB                  ACCOUNT NO.:  [id number]   MEDICAL RECORD NO.:  [id number]                   PATIENT TYPE:  INP   LOCATION:  9101                                 [facility]   PHYSICIAN:  Hal Morales, M.D.             DATE OF BIRTH:  1970-02-16   DATE OF ADMISSION:  08/12/2001  DATE OF DISCHARGE:                                HISTORY & PHYSICAL   HISTORY OF PRESENT ILLNESS:  The patient is a 40 year old, married, African-  American female, G4, P1-0-2-1, at approximately 19-6/7 weeks by ultrasound.  She presents to maternity admissions today by EMS secondary to complaints of  nausea and vomiting over the whole weekend that has increased since  Saturday.  She also complains of overall body aches, sore throat, sinus  infection, and sudden onset of severe right flank pain this morning.  She  states that she called the midwife on call yesterday complaining of her  sinus infections issues because she said the drainage from her nose was  making her feel more nauseated and requested a prescription for antibiotics  which she had called in, but never picked up because she was feeling so  achy and tired.  She denies any uterine contractions.  She denies any  vaginal bleeding or leaking of fluid.  She reports lots of fetal movement.  She denies any fevers or chills.  She reports that she vomits numerous times  a day and especially if she tries to take anything even sips of water or  juice.  She has been evaluated frequently to maternity admissions for  hyperemesis and has had already tried Phenergan, Reglan, Zofran and the  prednisone taper with the Reglan pump.  She had the best success with the  Reglan pump and she discontinued it herself after about three days stating  that she was eating fine at that time.  She discontinued it on July 08, 2001.  She has never had a gallbladder ultrasound or any history of  gallbladder disease per her report.  She  also reports no history of kidney  stones in the past.  Her pregnancy has been followed at [facility] to  date and has been essentially complicated by this history of hyperemesis,  questionable LMP and a history of hyperemesis with her prior pregnancy.   PAST OBSTETRIC HISTORY:  She is a G4, P1-0-2-1 who had a normal spontaneous  vaginal delivery in July 1996, of a viable female infant who weighed 5  pound, 12 ounces at 40 weeks' gestation following 12-hour labor.  She  delivered vaginally with no complications other than the hyperemesis  throughout that pregnancy.  In 2001, she had a miscarriage with no  complications.  In 2002, she also had a miscarriage with no complications.   ALLERGY:  LATEX causes swelling.   PAST SURGICAL HISTORY:  Wisdom teeth removal.   PAST MEDICAL HISTORY:  Hospitalization for child birth.   FAMILY HISTORY:  Significant for father and sister with heart disease.  Father, maternal grandmother and grandfather with chronic hypertension.  Paternal grandmother with emphysema.  Mother with stroke and cancer.   GENETIC HISTORY:  Negative.   SOCIAL HISTORY:  She is married with a supportive environment and she is  employed full-time at a pharmacy.   PHYSICAL EXAMINATION:  VITAL SIGNS:  Blood pressure 108/62, temperature at  3:10 p.m. was 100.7.  Prior to that, she had been afebrile.  Her fetal heart  rate is in the 150s by Doppler.  HEENT:  Grossly within normal limits.  She has reddened edges to her  tonsils, but no patches noted.  Her right ear drum appears within normal  limits.  HEART:  Regular rate and rhythm.  CHEST:  Clear.  BREASTS:  Soft and nontender.  ABDOMEN:  Gravid with fundal height around her umbilicus.  Fetal heart tones  of 150.  Soft, no rebound tenderness and positive bowel sounds.  EXTREMITIES:  Within normal limits.  PELVIC:  Cervix long and closed.  She has positive right-sided CVA  tenderness, negative left-sided CVA  tenderness.   LABORATORY DATA AND X-RAY FINDINGS:  Catheterized UA was significant for 30  of protein and 2.0 of urobilinogen, otherwise negative and was sent to  culture.   ASSESSMENT:  1. Intrauterine pregnancy at 19-6/7 weeks.  2. Persistent nausea and vomiting.  3. Possible viral syndrome.  4. Possible kidney stone versus pyelonephritis.   PLAN:  1. Admit for 23-hour observation.  2. Obtain CBC, CMET, TSH and abdominal pelvic CT scan.     Concha Pyo. Duplantis, C.N.M.              Hal Morales, M.D.    SJD/MEDQ  D:  08/12/2001  T:  08/12/2001  Job:  [id number]

## 2010-05-27 NOTE — H&P (Signed)
NAMEMURLINE, WEIGEL                         ACCOUNT NO.:  [id number]   MEDICAL RECORD NO.:  [id number]                   PATIENT TYPE:  MAT   LOCATION:  [facility]                                 [facility]   PHYSICIAN:  Osborn Coho, M.D.                DATE OF BIRTH:  02/25/1970   DATE OF ADMISSION:  12/12/2001  DATE OF DISCHARGE:                                HISTORY & PHYSICAL   HISTORY OF PRESENT ILLNESS:  The patient is a 40 year old married black  female, gravida 4, para 1-0-2-1, at 37-2/7 weeks who is being admitted for  complaints of persistent nausea and vomiting for the last three days and  fever this morning initially of 100.6, now later was 101.4 and after Tylenol  was 100.8.  She reports that she checked her urine for ketones at home this  morning and had 3+ ketones and was requested by the [facility]  office to come to maternity admission unit for IV fluids and evaluation.  She denies any headache and reports right upper quadrant pain for the last  couple of weeks with no increase recently.  She denies any visual  disturbances and no swelling.  Her pregnancy has been followed at [facility] by the certified nurse midwife service and has been essentially  uncomplicated, though, at risk for questionable LMP and nausea and vomiting  throughout this pregnancy, frequently requiring IV fluids.   OB/GYN HISTORY:  She is a gravida 4, para 1-0-2-1, who delivered a viable  female infant in July of 1996 who weighed 5 pounds 12 ounces at [redacted] weeks  gestation following a 12-hour labor. She had no complications with that  delivery.  In 2001, she had a miscarriage with no complications.  In 2002,  she had another miscarriage with no complications.   ALLERGIES:  No known drug allergies.   PAST MEDICAL HISTORY:  She reports having had the usual childhood diseases.  No medical history. Her only surgery was wisdom teeth removal.   FAMILY HISTORY:  Significant  for father and sister with heart disease.  Father, maternal grandmother, and maternal grandfather with hypertension.  Maternal grandmother with emphysema.  Mother with throat cancer.   GENETIC HISTORY:  Negative.   SOCIAL HISTORY:  She is married. The father of the baby is involved and  supportive.  They deny any illicit drug use, alcohol, or smoking with this  pregnancy.   PRENATAL LABORATORY DATA:  Blood type O positive, antibody screen is  negative, sickle cell trait negative.  Rubella immune.  Hepatitis B surface  antigen negative.  HIV nonreactive.  GC and Chlamydia both negative.  Pap  within normal limits.  Her one-hour Glucola was 113 and a maternal serum  alpha fetoprotein was within normal range.  Her 36-week beta Strep was  positive.   PHYSICAL EXAMINATION:  VITAL SIGNS:  Temperature on admission 101.4, pulse  116,  respirations 18, and blood pressure 121/73.  After Tylenol her  temperature was 100.8, blood pressure 116/62, pulse 108, and respirations  18.  HEENT:  Grossly within normal limits.  HEART:  Regular rate and rhythm.  CHEST:  Clear.  BREASTS:  Soft and nontender.  ABDOMEN:  Gravid with irregular mild uterine contractions. Her fetal heart  rate is reactive, but with a baseline in the 160s to 170s.  PELVIC:  Cervix is closed, posterior, vertex at -1 and the patient is a very  difficult examination due to discomfort.  EXTREMITIES:  Within normal limits.   LABORATORY DATA:  Catheterized UA is negative for protein.  Specific gravity  is 1.010 and ketones are 40.  SGOT is 16, SGPT less than 19, electrolytes  are within normal range.  WBC 8.3, hemoglobin 11.1, platelets 218,  neutrophils 86.   ASSESSMENT:  1. Intrauterine pregnancy at 37-2/7 weeks.  2. Probable gastrointestinal flu virus.   PLAN:  Per consult with Dr. Su Hilt, admit to labor and delivery or  antenatal for 23-hour observation and IV fluid.     Concha Pyo. Duplantis, C.N.M.              Osborn Coho, M.D.    SJD/MEDQ  D:  12/12/2001  T:  12/12/2001  Job:  [id number]

## 2010-05-27 NOTE — H&P (Signed)
NAMEPATCHES, MCDONNELL                         ACCOUNT NO.:  [id number]   MEDICAL RECORD NO.:  [id number]                   PATIENT TYPE:  INP   LOCATION:  9160                                 [facility]   PHYSICIAN:  Janine Limbo, M.D.            DATE OF BIRTH:  [birth year]   DATE OF ADMISSION:  12/23/2001  DATE OF DISCHARGE:                                HISTORY & PHYSICAL   HISTORY OF PRESENT ILLNESS:  The patient is a 40 year old gravida 4 para 1-0-  2-1 at 38-6/7 weeks who presents for induction secondary to prolonged issues  with nausea and vomiting throughout her pregnancy and severe pelvic pain  compromising ambulation ability.  The patient has been seen multiple times  in maternity admissions or at the office with above complaints.  She was  admitted for 23-hour observation approximately 1 week ago for viral syndrome  with fever, nausea, and vomiting.  This essentially resolved but sporadic  nausea and vomiting continued as they have throughout the pregnancy.  The  patient denies any upper abdominal pain, dysuria, bleeding.  She does report  positive fetal movement.  Pregnancy has been remarkable for (1) positive  group B strep, (2) two SABs, (3) persistent nausea and vomiting throughout  pregnancy, (4) history of previous alcohol and tobacco use but the patient  stopped with positive UPT, (5) questionable LATEX SENSITIVITY TO GLOVES with  exams.   PRENATAL LABORATORIES:  Blood type is O positive, Rh antibody negative.  VDRL nonreactive.  Rubella titer positive.  Hepatitis B surface antigen  negative.  HIV nonreactive.  Sickle cell test negative.  Pap smear was  normal.  Glucose challenge was normal.  AFP was normal.  Hemoglobin upon  entering the practice was 12.8; it was within normal limits at 28 weeks.  EDC of December 31, 2001 was established by ultrasound at approximately 7  weeks.  Group B strep culture was positive at 36 weeks.  Cultures were also  negative.   HISTORY OF PRESENT PREGNANCY:  The patient entered care at approximately 10  weeks.  She had had an ultrasound at 7 weeks.  She has had severe nausea and  vomiting unrelieved with Phenergan, she was prescribed Reglan but that began  to stop working, she was then placed on Zofran but wanted to have a Reglan  pump but insurance issues were a concern, methylprednisolone was used with  some benefit.  Eventually she was approved for use with Reglan pump and was  begun on that at approximately 15 weeks, the patient disconnected herself at  approximately 16 weeks and the patient refused to have it restarted, she  began to tolerate more food and decreased nausea.  Her vomiting began  continuing at about 22 weeks and continued sporadically.  She then also was  having pain, right lower quadrant pain, was treated with a 2- to 3-day  hospitalization at 21 weeks.  She did  have positive Epstein-Barr virus titer  noted consistent with old exposure.  She had various complaints.  She was  taken out of work at 27 weeks.  She could not keep her glucola down; it was  only 10.6 at 29 weeks.  She in the latter part of her pregnancy continued  with significant contractions which were treated with terbutaline.  She  never had any cervical change.  She had vomiting onset again at  approximately 37 weeks with fever.  She was admitted for 23-hour  observation.  She also was having pelvic pain at that time.  Until 38 weeks,  she continued with the same complaints although of lesser intensity.  The  patient began to request induction secondary to her issues of nausea,  vomiting and ongoing pelvic discomfort which did not seem to be related to  contractions or any other obstetrical cause.   OBSTETRICAL HISTORY:  In 1996, the patient had a vaginal birth of a female  infant weight 5 pounds 12 ounces at [redacted] weeks gestation.  She was in labor 12  hours.  She had no complications.  She had no pain medication.   In 2001 and  2002, she had first-trimester miscarriages.  With her 2002 pregnancy, she  had a D&C.  She did have hyperemesis with her first pregnancy.  She did have  preterm contractions with that pregnancy but then had a term delivery.   MEDICAL AND SURGICAL HISTORY:  She has occasional yeast infections; reports  the usual childhood illnesses; only other surgery was for wisdom teeth  removal and the D&C previously noted.   ALLERGIES:  She has a questionable sensitivity to LATEX GLOVES with exam  although there is not a systemic latex allergy.   FAMILY HISTORY:  Her father and sister have some type of heart disease.  Her  father, maternal grandmother, and maternal grandfather have hypertension.  Her mother had throat cancer.  Her paternal grandmother had emphysema.   GENETIC HISTORY:  Genetic history is unremarkable.   SOCIAL HISTORY:  The patient is single.  The father of the baby is involved  and supportive.  The patient was previously employed in retail.  She has  been followed by the Certified Nurse Midwife Service at [facility].  She denies any alcohol, drug, or tobacco use once she had a positive UPT.   PHYSICAL EXAMINATION:  VITAL SIGNS:  Stable.  The patient is afebrile.  HEENT:  Within normal limits.  LUNGS:  Bilateral breath sounds are clear.  HEART:  Regular and without murmur.  BREASTS:  Soft and nontender.  ABDOMEN:  Fundal height is approximately 38 cm, estimated fetal weight 6-1/2  to 7 pounds.  Uterine contractions are irregular and mild.  CERVICAL:  Posterior, 2 cm, 80%, vertex, at a 0 to +1 station, membranes are  intact.  Fetal heart rate is reassuring, occasional mild variables noted.   IMPRESSION:  1. Intrauterine pregnancy at 38-6/7 weeks.  2. For induction secondary to musculoskeletal issues and persistent nausea-     vomiting.  3. Positive group B streptococci.   PLAN:  1. Admit to birthing suite per consult with Dr. Stefano Gaul as attending     physician.  2. Pitocin induction.  3. Plan group B strep prophylaxis with ampicillin - this is recommended from     the pharmacy secondary to lesser dosing regimen allowing for less     punctures of the tubing in light of the patient's LATEX allergy.  4. Risks  and benefits of induction reviewed with the patient including     failure of the method, prolonged induction at the time, intrauterine     fetal distress, and need for additional interventions.  The patient seems     to understand risks and benefits and wishes to proceed with induction.     Renaldo Reel Emilee Hero, C.N.M.                   Janine Limbo, M.D.    VLL/MEDQ  D:  12/23/2001  T:  12/23/2001  Job:  [id number]

## 2010-05-27 NOTE — Op Note (Signed)
[facility]  Patient:    Rebekah [name], Rebekah [name]               MRN: [id number] Proc. Date: 04/27/00 Adm. Date:  [id number] Attending:  Leonard Schwartz                           Operative Report  PREOPERATIVE DIAGNOSIS:       First trimester missed abortion.  POSTOPERATIVE DIAGNOSIS:      First trimester missed abortion.  OPERATION:                    Suction dilatation and evacuation.  SURGEON:                      Janine Limbo, M.D.  ANESTHESIA:                   IV sedation and paracervical block.  DISPOSITION:                  The patient is a 40 year old female, gravida 3, para 1-0-1-1, who presents with a first trimester missed abortion.  She understands the indications for her procedure and she accepts the risks of, but not limited to anesthetic complications, bleeding, infection, and possible damage to the surrounding organs.  FINDINGS:                     The patients blood type is O-positive.  A moderate amount of products of conception were removed within the uterine cavity.  DESCRIPTION OF PROCEDURE:     The patient was taken to the operating room where she was given medication through her IV line.  The abdomen, perineum, and vagina were prepped with multiple layers of Betadine.  The bladder was drained of urine.  The patient was sterilely draped.  A paracervical block was placed using 10 cc of 0.5% Marcaine.  The uterus sounded to 10 cm.  The cervix was gradually dilated.  The uterine cavity was evacuated using a #8 suction curet followed by a medium sharp curet.  The cavity was felt to be clean. Hemostasis was noted to be adequate.  All instruments were removed.  Repeat examination showed the uterus to be firm.  FOLLOWUP INSTRUCTIONS:        The patient will return to see Dr. Stefano Gaul in two to three weeks for follow up examination.  She was given a copy of the postoperative instruction sheet as prepared by the [facility] for patients who have undergone a dilatation and curettage.  She was given a prescription for Vicodin one to two p.o. q.4h. p.r.n. pain.  She was given a total of 20 tablets with no refills. DD:  04/27/00 TD:  04/28/00 Job: [id number]

## 2011-07-25 ENCOUNTER — Ambulatory Visit (INDEPENDENT_AMBULATORY_CARE_PROVIDER_SITE_OTHER): Payer: 59 | Admitting: Family Medicine

## 2011-07-25 ENCOUNTER — Telehealth: Payer: Self-pay | Admitting: Family Medicine

## 2011-07-25 ENCOUNTER — Encounter: Payer: Self-pay | Admitting: Family Medicine

## 2011-07-25 ENCOUNTER — Ambulatory Visit: Payer: Self-pay | Admitting: Internal Medicine

## 2011-07-25 VITALS — BP 126/79 | HR 88 | Temp 98.7°F | Ht 68.0 in | Wt 166.8 lb

## 2011-07-25 DIAGNOSIS — J329 Chronic sinusitis, unspecified: Secondary | ICD-10-CM | POA: Insufficient documentation

## 2011-07-25 DIAGNOSIS — K0889 Other specified disorders of teeth and supporting structures: Secondary | ICD-10-CM | POA: Insufficient documentation

## 2011-07-25 DIAGNOSIS — K089 Disorder of teeth and supporting structures, unspecified: Secondary | ICD-10-CM

## 2011-07-25 MED ORDER — HYDROCODONE-ACETAMINOPHEN 5-500 MG PO TABS: 1.0000 | ORAL_TABLET | Freq: Three times a day (TID) | ORAL | Status: AC | PRN

## 2011-07-25 MED ORDER — AMOXICILLIN-POT CLAVULANATE 875-125 MG PO TABS: 1.0000 | ORAL_TABLET | Freq: Two times a day (BID) | ORAL | Status: AC

## 2011-07-25 NOTE — Progress Notes (Signed)
  Subjective:    Patient ID: Rebekah [name], female    DOB: [birth year], 41 y.o.   MRN: [id number]  HPI R dental pain- filling fell out of R upper tooth, tooth cracked, requiring root canal.  Having severe pain.  Now w/ R ear pain, sinus pain.  + PND, sore throat.  + hoarseness.  No cough.  Took OTC allergy med w/out relief.  Having chest congestion and tightness.   Review of Systems For ROS see HPI     Objective:   Physical Exam  Vitals reviewed. Constitutional: She appears well-developed and well-nourished. No distress.  HENT:  Head: Normocephalic and atraumatic.  Right Ear: Tympanic membrane normal.  Left Ear: Tympanic membrane normal.  Nose: Mucosal edema and rhinorrhea present. Right sinus exhibits maxillary sinus tenderness. Right sinus exhibits no frontal sinus tenderness. Left sinus exhibits no maxillary sinus tenderness and no frontal sinus tenderness.  Mouth/Throat: Uvula is midline and mucous membranes are normal. Posterior oropharyngeal erythema present. No oropharyngeal exudate.  Eyes: Conjunctivae and EOM are normal. Pupils are equal, round, and reactive to light.  Neck: Normal range of motion. Neck supple.  Cardiovascular: Normal rate, regular rhythm and normal heart sounds.   Pulmonary/Chest: Effort normal and breath sounds normal. No respiratory distress. She has no wheezes.  Lymphadenopathy:    She has no cervical adenopathy.          Assessment & Plan:

## 2011-07-25 NOTE — Telephone Encounter (Signed)
Caller: Rebekah [name]/Patient; PCP: Sheliah Hatch.; CB#: [phone]; Call regarding chest pain and soreness.  Has a tooth ache as well and sinus drainage.  Has had the sx for some time.  No fever. Her dentist cannot see her today.  Triaged per Chest Pain.   Scheduled for 230p today with Dr. Drue Novel.

## 2011-07-25 NOTE — Patient Instructions (Addendum)
This is a sinus infection/tooth infection Start the Augmentin twice daily- w/ food to prevent upset stomach Call the dentist and get your root canal scheduled Use the Vicodin for pain Drink plenty of fluids! Mucinex to thin your congestion and drainage Call with any questions or concerns Hang in there!!

## 2011-07-25 NOTE — Telephone Encounter (Signed)
FYI

## 2011-07-25 NOTE — Assessment & Plan Note (Signed)
New.  Start augmentin.  Reviewed supportive care and red flags that should prompt return.  Pt expressed understanding and is in agreement w/ plan.  

## 2011-07-25 NOTE — Assessment & Plan Note (Signed)
New.  Encouraged pt to f/u w/ dentist for necessary root canal.  vicodin given for current pain.  Reviewed supportive care and red flags that should prompt return.  Pt expressed understanding and is in agreement w/ plan.

## 2011-12-04 ENCOUNTER — Other Ambulatory Visit: Payer: Self-pay | Admitting: Obstetrics & Gynecology

## 2011-12-04 DIAGNOSIS — N644 Mastodynia: Secondary | ICD-10-CM

## 2011-12-04 DIAGNOSIS — N959 Unspecified menopausal and perimenopausal disorder: Secondary | ICD-10-CM

## 2011-12-12 ENCOUNTER — Ambulatory Visit
Admission: RE | Admit: 2011-12-12 | Discharge: 2011-12-12 | Disposition: A | Discharge status: Home / Self Care | Payer: 59 | Source: Ambulatory Visit | Attending: Obstetrics & Gynecology | Admitting: Obstetrics & Gynecology

## 2011-12-12 ENCOUNTER — Other Ambulatory Visit: Payer: Self-pay | Admitting: Obstetrics & Gynecology

## 2011-12-12 DIAGNOSIS — N644 Mastodynia: Secondary | ICD-10-CM

## 2011-12-12 DIAGNOSIS — N959 Unspecified menopausal and perimenopausal disorder: Secondary | ICD-10-CM

## 2011-12-25 ENCOUNTER — Encounter (HOSPITAL_COMMUNITY): Payer: Self-pay | Admitting: *Deleted

## 2011-12-25 ENCOUNTER — Observation Stay (HOSPITAL_COMMUNITY)
Admission: EM | Admit: 2011-12-25 | Discharge: 2011-12-26 | Disposition: A | Discharge status: Home / Self Care | Payer: 59 | Attending: Emergency Medicine | Admitting: Emergency Medicine

## 2011-12-25 ENCOUNTER — Emergency Department (HOSPITAL_COMMUNITY): Payer: 59

## 2011-12-25 DIAGNOSIS — M25519 Pain in unspecified shoulder: Secondary | ICD-10-CM | POA: Insufficient documentation

## 2011-12-25 DIAGNOSIS — F17200 Nicotine dependence, unspecified, uncomplicated: Secondary | ICD-10-CM | POA: Insufficient documentation

## 2011-12-25 DIAGNOSIS — R079 Chest pain, unspecified: Principal | ICD-10-CM | POA: Insufficient documentation

## 2011-12-25 DIAGNOSIS — F411 Generalized anxiety disorder: Secondary | ICD-10-CM | POA: Insufficient documentation

## 2011-12-25 DIAGNOSIS — M542 Cervicalgia: Secondary | ICD-10-CM | POA: Insufficient documentation

## 2011-12-25 HISTORY — DX: Anxiety disorder, unspecified: F41.9

## 2011-12-25 LAB — CBC
HCT: 38.4 % (ref 36.0–46.0)
Hemoglobin: 13 g/dL (ref 12.0–15.0)
MCH: 27.1 pg (ref 26.0–34.0)
MCHC: 33.9 g/dL (ref 30.0–36.0)
MCV: 80 fL (ref 78.0–100.0)
Platelets: 259 10*3/uL (ref 150–400)
RBC: 4.8 MIL/uL (ref 3.87–5.11)
RDW: 14 % (ref 11.5–15.5)
WBC: 6.3 10*3/uL (ref 4.0–10.5)

## 2011-12-25 LAB — BASIC METABOLIC PANEL
BUN: 13 mg/dL (ref 6–23)
CO2: 27 mEq/L (ref 19–32)
Calcium: 9.2 mg/dL (ref 8.4–10.5)
Chloride: 103 mEq/L (ref 96–112)
Creatinine, Ser: 0.88 mg/dL (ref 0.50–1.10)
GFR calc Af Amer: >90 mL/min (ref >90)
GFR calc non Af Amer: 81 mL/min — ABNORMAL LOW (ref >90)
Glucose, Bld: 100 mg/dL — ABNORMAL HIGH (ref 70–99)
Potassium: 3.9 mEq/L (ref 3.5–5.1)
Sodium: 139 mEq/L (ref 135–145)

## 2011-12-25 LAB — TROPONIN I
Troponin I: <0.3 ng/mL (ref <0.30)
Troponin I: <0.3 ng/mL (ref <0.30)

## 2011-12-25 MED ORDER — ASPIRIN 81 MG PO CHEW
324.0000 mg | CHEWABLE_TABLET | Freq: Once | ORAL | Status: AC
  Administered 2011-12-25: 324 mg via ORAL
  Filled 2011-12-25: qty 4

## 2011-12-25 MED ORDER — SODIUM CHLORIDE 0.9 % IV SOLN
20.0000 mL | INTRAVENOUS | Status: DC
  Administered 2011-12-25: 20 mL via INTRAVENOUS

## 2011-12-25 NOTE — ED Notes (Signed)
MD at bedside. 

## 2011-12-25 NOTE — ED Notes (Signed)
Patient transported to X-ray 

## 2011-12-25 NOTE — ED Provider Notes (Signed)
History    41 year old female with chest pain. Left-sided. Radiates her left shoulder and left neck. Patient has been intermittently having this pain for the past several months. No appreciable exacerbating relieving factors. Lasts minutes. Not associated with shortness of breath, palpitations, diaphoresis or nausea. No unusual leg pain or swelling. Denies history of blood clot. Patient is a smoker. No diagnosed history of hypertension but she states that was noted on recent evaluations and again on triage vitals.  CSN: [id number]  Arrival date & time 12/25/11  1731   First MD Initiated Contact with Patient 12/25/11 1818      Chief Complaint  Patient presents with  . Chest Pain    (Consider location/radiation/quality/duration/timing/severity/associated sxs/prior treatment) HPI  Past Medical History  Diagnosis Date  . Anxiety     Past Surgical History  Procedure Date  . Wisdom tooth extraction     No family history on file.  History  Substance Use Topics  . Smoking status: Former Smoker -- 0.3 packs/day for 20 years  . Smokeless tobacco: Not on file     Comment: notes sometimes smokes one to two cigarettes daily  . Alcohol Use: Yes     Comment: daily-notes wine    OB History    Grav Para Term Preterm Abortions TAB SAB Ect Mult Living                  Review of Systems  All systems reviewed and negative, other than as noted in HPI.   Allergies  Latex  Home Medications   Current Outpatient Rx  Name  Route  Sig  Dispense  Refill  . IBUPROFEN 200 MG PO TABS   Oral   Take 200 mg by mouth every 6 (six) hours as needed.           BP 148/102  Pulse 80  Resp 15  Ht 5\' 8"  (1.727 m)  Wt 170 lb (77.111 kg)  BMI 25.85 kg/m2  SpO2 100%  LMP 06/25/2011  Physical Exam  Nursing note and vitals reviewed. Constitutional: She appears well-developed and well-nourished. No distress.  HENT:  Head: Normocephalic and atraumatic.  Eyes: Conjunctivae normal are  normal. Right eye exhibits no discharge. Left eye exhibits no discharge.  Neck: Neck supple.  Cardiovascular: Normal rate, regular rhythm and normal heart sounds.  Exam reveals no gallop and no friction rub.   No murmur heard. Pulmonary/Chest: Effort normal and breath sounds normal. No respiratory distress.       Chest pain is not reproducible on palpation. Chest wall is grossly normal in appearance.  Abdominal: Soft. She exhibits no distension. There is no tenderness.  Musculoskeletal: She exhibits no edema and no tenderness.       Lower extremities symmetric as compared to each other. No calf tenderness. Negative Homan's. No palpable cords.   Neurological: She is alert.  Skin: Skin is warm and dry.  Psychiatric: She has a normal mood and affect. Her behavior is normal. Thought content normal.    ED Course  Procedures (including critical care time)  Labs Reviewed  BASIC METABOLIC PANEL - Abnormal; Notable for the following:    Glucose, Bld 100 (*)     GFR calc non Af Amer 81 (*)     All other components within normal limits  CBC  TROPONIN I  TROPONIN I  TROPONIN I   Dg Chest 2 View  12/25/2011  *RADIOLOGY REPORT*  Clinical Data: Mid left chest pain.  Ex-smoker.  CHEST -  2 VIEW  Comparison: 12/07/2003.  Findings: The heart remains normal in size and the lungs are clear. Mild scoliosis.  The interstitial markings remain minimally prominent.  IMPRESSION: Stable minimal chronic interstitial lung disease.  No acute abnormality.   Original Report Authenticated By: Beckie Salts, M.D.    EKG:  Rhythm: Normal sinus Rate: 89 Axis: Normal Intervals: Normal ST segments: Nonspecific ST changes. There is T-wave flattening in lead 3   1. Chest pain       MDM  41 year old female with left-sided chest pain. EKG with no ischemic changes. Initial troponin is normal. Patient with likely undiagnosed hypertension. She is also a smoker. No previous provocative testing. We'll transfer to [facility]  and the chest pain protocol. Patient's BMI is less than 35 and she is to get  CT coronaries in the morning assuming she is ruled out enzymatically.         Raeford Razor, MD 12/25/11 2113

## 2011-12-25 NOTE — ED Notes (Signed)
EKG given to EDP, Kohut,MD. 

## 2011-12-25 NOTE — ED Notes (Signed)
Patient seen at her OBGYN earlier today, has been experiencing left breat pain that radiates to her left arm, to back and up her neck. Patient sts this has been having this same pain for a "few months" and thought it was her breast, after mammogram this morning OB told patient that due to her HTN and pain she needed to be checked at emergency dept. For possible cardiac complications.

## 2011-12-26 ENCOUNTER — Observation Stay (HOSPITAL_COMMUNITY): Payer: 59

## 2011-12-26 LAB — POCT I-STAT TROPONIN I: Troponin i, poc: 0 ng/mL (ref 0.00–0.08)

## 2011-12-26 MED ORDER — OXYCODONE-ACETAMINOPHEN 5-325 MG PO TABS
2.0000 | ORAL_TABLET | Freq: Once | ORAL | Status: AC
  Administered 2011-12-26: 2 via ORAL
  Filled 2011-12-26: qty 2

## 2011-12-26 MED ORDER — METOPROLOL TARTRATE 25 MG PO TABS
100.0000 mg | ORAL_TABLET | Freq: Once | ORAL | Status: AC
  Administered 2011-12-26: 100 mg via ORAL
  Filled 2011-12-26: qty 4

## 2011-12-26 MED ORDER — MORPHINE SULFATE 4 MG/ML IJ SOLN
4.0000 mg | Freq: Once | INTRAMUSCULAR | Status: AC
  Administered 2011-12-26: 4 mg via INTRAVENOUS
  Filled 2011-12-26: qty 1

## 2011-12-26 MED ORDER — NITROGLYCERIN 0.4 MG SL SUBL
0.4000 mg | SUBLINGUAL_TABLET | Freq: Once | SUBLINGUAL | Status: AC
  Administered 2011-12-26: 0.4 mg via SUBLINGUAL
  Filled 2011-12-26: qty 25

## 2011-12-26 MED ORDER — ONDANSETRON HCL 4 MG/2ML IJ SOLN
4.0000 mg | Freq: Once | INTRAMUSCULAR | Status: AC
  Administered 2011-12-26: 4 mg via INTRAVENOUS
  Filled 2011-12-26: qty 2

## 2011-12-26 MED ORDER — METOPROLOL TARTRATE 1 MG/ML IV SOLN
5.0000 mg | Freq: Once | INTRAVENOUS | Status: AC
  Administered 2011-12-26: 2.5 mg via INTRAVENOUS
  Filled 2011-12-26: qty 10

## 2011-12-26 MED ORDER — OMEPRAZOLE 20 MG PO CPDR: DELAYED_RELEASE_CAPSULE | ORAL | Status: DC

## 2011-12-26 MED ORDER — IOHEXOL 350 MG/ML SOLN
80.0000 mL | Freq: Once | INTRAVENOUS | Status: AC | PRN
  Administered 2011-12-26: 80 mL via INTRAVENOUS

## 2011-12-26 NOTE — ED Provider Notes (Signed)
7:52 AM Handoff from patient chart.   Patient was transferred from [facility] for CPP: CT angiography.   She has remained pain free overnight. Troponin x 4 have remained negative. VS WNL.  Exam:  Gen NAD; Heart RRR, nml S1,S2, no m/r/g; Lungs CTAB; Abd soft, NT, no rebound or guarding; Ext 2+ pedal pulses bilaterally, no edema.  11:57 AM CT with calcium score of zero. Pt informed. Will start on omeprazole due to distal esophageal thickening. Pt informed.   Patient was counseled to return with severe chest pain, especially if the pain is crushing or pressure-like and spreads to the arms, back, neck, or jaw, or if they have sweating, nausea, or shortness of breath with the pain. They were encouraged to call 911 with these symptoms.   They were also told to return if their chest pain gets worse and does not go away with rest, they have an attack of chest pain lasting longer than usual despite rest and treatment with the medications their caregiver has prescribed, if they wake from sleep with chest pain or shortness of breath, if they feel dizzy or faint, if they have chest pain not typical of their usual pain, or if they have any other emergent concerns regarding their health.  The patient verbalized understanding and agreed.    Renne Crigler, [place] 12/26/11 1158

## 2011-12-26 NOTE — ED Notes (Signed)
Received verbal report from Carollee Herter, RN for [employer] for Chest pain protocol/ advised RN that patient can be transported to CDU 2

## 2011-12-26 NOTE — ED Notes (Signed)
Pt given turkey sandwich and sprite.  

## 2011-12-26 NOTE — ED Provider Notes (Signed)
Medical screening examination/treatment/procedure(s) were performed by non-physician practitioner and as supervising physician I was immediately available for consultation/collaboration.   Dequon Schnebly, MD 12/26/11 1544 

## 2011-12-26 NOTE — ED Notes (Signed)
Patients BMI 25.8

## 2011-12-26 NOTE — ED Notes (Signed)
Explained Chest pain protocol to patient/ advised pt she must stay NPO @4am  until after test have resulted/ advised EKG will be completed at 6am/ adm medication if applicable.

## 2012-02-17 ENCOUNTER — Encounter (HOSPITAL_COMMUNITY): Payer: Self-pay | Admitting: *Deleted

## 2012-02-17 ENCOUNTER — Emergency Department (HOSPITAL_COMMUNITY)
Admission: EM | Admit: 2012-02-17 | Discharge: 2012-02-17 | Disposition: A | Discharge status: Home / Self Care | Payer: 59 | Attending: Emergency Medicine | Admitting: Emergency Medicine

## 2012-02-17 DIAGNOSIS — Z87891 Personal history of nicotine dependence: Secondary | ICD-10-CM | POA: Insufficient documentation

## 2012-02-17 DIAGNOSIS — Z7982 Long term (current) use of aspirin: Secondary | ICD-10-CM | POA: Insufficient documentation

## 2012-02-17 DIAGNOSIS — Z79899 Other long term (current) drug therapy: Secondary | ICD-10-CM | POA: Insufficient documentation

## 2012-02-17 DIAGNOSIS — Z8659 Personal history of other mental and behavioral disorders: Secondary | ICD-10-CM | POA: Insufficient documentation

## 2012-02-17 DIAGNOSIS — R040 Epistaxis: Secondary | ICD-10-CM | POA: Insufficient documentation

## 2012-02-17 DIAGNOSIS — I10 Essential (primary) hypertension: Secondary | ICD-10-CM | POA: Insufficient documentation

## 2012-02-17 HISTORY — DX: Essential (primary) hypertension: I10

## 2012-02-17 NOTE — ED Provider Notes (Signed)
History     CSN: [id number]  Arrival date & time 02/17/12  0819   First MD Initiated Contact with Patient 02/17/12 [phone]      Chief Complaint  Patient presents with  . Epistaxis    (Consider location/radiation/quality/duration/timing/severity/associated sxs/prior treatment) HPI Comments: 42 year old African American female with history of controlled hypertension presents with nose bleed from both nares x 1 hour. Began after rubbing her nose and stopped 45 min later after applying pressure. Says she also  "spit out" a few clots while her nose was bleeding. Feels lightheaded from the stress. Denies chest pain, shortness of breath, dizziness, N/V. Denies taking blood thinners or using cocaine.   Patient is a 42 y.o. female presenting with nosebleeds. The history is provided by the patient.  Epistaxis     Past Medical History  Diagnosis Date  . Anxiety   . Hypertension     Past Surgical History  Procedure Laterality Date  . Wisdom tooth extraction      History reviewed. No pertinent family history.  History  Substance Use Topics  . Smoking status: Former Smoker -- 0.30 packs/day for 20 years  . Smokeless tobacco: Not on file     Comment: notes sometimes smokes one to two cigarettes daily  . Alcohol Use: Yes     Comment: daily-notes wine    OB History   Grav Para Term Preterm Abortions TAB SAB Ect Mult Living                  Review of Systems  Constitutional: Negative for activity change.  HENT: Positive for nosebleeds. Negative for neck pain.   Respiratory: Negative for shortness of breath.   Cardiovascular: Negative for chest pain.  Gastrointestinal: Negative for nausea, vomiting and abdominal pain.  Genitourinary: Negative for dysuria.  Neurological: Negative for headaches.    Allergies  Latex  Home Medications   Current Outpatient Rx  Name  Route  Sig  Dispense  Refill  . Aspirin-Salicylamide-Caffeine (BC HEADACHE POWDER PO)   Oral   Take 1 Package  by mouth daily as needed. For pain         . diclofenac (VOLTAREN) 75 MG EC tablet   Oral   Take 75 mg by mouth 2 (two) times daily.         Marland Kitchen ibuprofen (ADVIL,MOTRIN) 200 MG tablet   Oral   Take 200 mg by mouth every 6 (six) hours as needed. For pain         . lisinopril (PRINIVIL,ZESTRIL) 10 MG tablet   Oral   Take 10 mg by mouth daily.           BP 143/97  Pulse 88  Temp(Src) 97.2 F (36.2 C) (Oral)  Resp 20  SpO2 95%  LMP 06/25/2011  Physical Exam  Nursing note and vitals reviewed. Constitutional: She is oriented to person, place, and time. She appears well-developed and well-nourished.  HENT:  Head: Normocephalic and atraumatic.  Bilateral nare has dried blood - and it appears, based on the scab seen that the bleeding is anterior.  Eyes: EOM are normal. Pupils are equal, round, and reactive to light.  Neck: Neck supple.  Cardiovascular: Normal rate, regular rhythm and normal heart sounds.   No murmur heard. Pulmonary/Chest: Effort normal. No respiratory distress.  Abdominal: Soft. She exhibits no distension. There is no tenderness. There is no rebound and no guarding.  Neurological: She is alert and oriented to person, place, and time.  Skin:  Skin is warm and dry.    ED Course  Procedures (including critical care time)  Labs Reviewed - No data to display No results found.   1. Epistaxis       MDM  Pt comes in with cc of epistaxis. With pressure holding, we were able to stop the bleed. Appears to be an anterior bleed.  Will d/c now. Good instructions verbalized. Asked her to keep the area moist, and apply pressure again for 5 minutes straight x 2 if repeat bleed, and to avoid removing the scab, or picking nose.   Derwood Kaplan, MD 02/17/12 [phone]

## 2012-02-17 NOTE — ED Notes (Addendum)
Reports nosebleed that started 15 mins ago. Recently dx with htn. bp 143/97

## 2013-03-10 ENCOUNTER — Ambulatory Visit (INDEPENDENT_AMBULATORY_CARE_PROVIDER_SITE_OTHER): Payer: 59 | Admitting: Obstetrics & Gynecology

## 2013-03-10 ENCOUNTER — Encounter: Payer: Self-pay | Admitting: Obstetrics & Gynecology

## 2013-03-10 VITALS — BP 139/100 | HR 93 | Temp 98.1°F | Ht 68.0 in | Wt 163.0 lb

## 2013-03-10 DIAGNOSIS — Z01419 Encounter for gynecological examination (general) (routine) without abnormal findings: Secondary | ICD-10-CM

## 2013-03-10 NOTE — Progress Notes (Signed)
Subjective:     Rebekah [name] is a 43 y.o. female here for a routine exam.  Current complaints: Patient states she thinks she is going through menopause. Patient states she has had the blood work done. Patient states she is having really bad night sweates and frequency of urination due to the diuretic in the blood pressure medication. Patient states doctor told her that birth control pills may help with some of the symptoms of menopause. Patient is taking medication for high blood pressure but is unsure of the dose. Patient states she thinks the name of the blood pressure medication is amlodipine. Patient states she is taking Phentermine, Topiramate, and Claritin.  Personal health questionnaire reviewed: yes.   Gynecologic History Patient's last menstrual period was 06/25/2011. Contraception: none Last Pap: 2013 Results: normal Last mammogram: 2014. Results were: normal  Obstetric History OB History  No data available     The following portions of the patient's history were reviewed and updated as appropriate: allergies, current medications, past family history, past medical history, past social history, past surgical history and problem list.  Review of Systems Pertinent items are noted in HPI.    Objective:      General appearance: alert Breasts: normal appearance, no masses or tenderness Abdomen: soft, non-tender; bowel sounds normal; no masses,  no organomegaly Pelvic: cervix normal in appearance, external genitalia normal, no adnexal masses or tenderness, uterus normal size, shape, and consistency and vagina normal without discharge       Assessment:    Healthy female exam.    Plan:  [facility]ord Return prn

## 2013-04-09 NOTE — Patient Instructions (Signed)

## 2013-12-17 ENCOUNTER — Encounter (HOSPITAL_COMMUNITY): Payer: Self-pay

## 2013-12-17 ENCOUNTER — Emergency Department (HOSPITAL_COMMUNITY)
Admission: EM | Admit: 2013-12-17 | Discharge: 2013-12-17 | Disposition: A | Discharge status: Home / Self Care | Payer: 59 | Attending: Emergency Medicine | Admitting: Emergency Medicine

## 2013-12-17 ENCOUNTER — Emergency Department (HOSPITAL_COMMUNITY): Payer: 59

## 2013-12-17 DIAGNOSIS — Z79899 Other long term (current) drug therapy: Secondary | ICD-10-CM | POA: Diagnosis not present

## 2013-12-17 DIAGNOSIS — Z8659 Personal history of other mental and behavioral disorders: Secondary | ICD-10-CM | POA: Insufficient documentation

## 2013-12-17 DIAGNOSIS — J208 Acute bronchitis due to other specified organisms: Secondary | ICD-10-CM | POA: Diagnosis not present

## 2013-12-17 DIAGNOSIS — Z720 Tobacco use: Secondary | ICD-10-CM | POA: Insufficient documentation

## 2013-12-17 DIAGNOSIS — Z791 Long term (current) use of non-steroidal anti-inflammatories (NSAID): Secondary | ICD-10-CM | POA: Diagnosis not present

## 2013-12-17 DIAGNOSIS — Z91040 Latex allergy status: Secondary | ICD-10-CM | POA: Diagnosis not present

## 2013-12-17 DIAGNOSIS — R079 Chest pain, unspecified: Secondary | ICD-10-CM

## 2013-12-17 DIAGNOSIS — I10 Essential (primary) hypertension: Secondary | ICD-10-CM | POA: Insufficient documentation

## 2013-12-17 LAB — BASIC METABOLIC PANEL
ANION GAP: 15 (ref 5–15)
BUN: 16 mg/dL (ref 6–23)
CALCIUM: 8.9 mg/dL (ref 8.4–10.5)
CO2: 21 meq/L (ref 19–32)
Chloride: 104 mEq/L (ref 96–112)
Creatinine, Ser: 0.83 mg/dL (ref 0.50–1.10)
GFR calc Af Amer: >90 mL/min (ref >90)
GFR, EST NON AFRICAN AMERICAN: 85 mL/min — AB (ref >90)
GLUCOSE: 88 mg/dL (ref 70–99)
POTASSIUM: 4 meq/L (ref 3.7–5.3)
SODIUM: 140 meq/L (ref 137–147)

## 2013-12-17 LAB — CBC
HCT: 38.6 % (ref 36.0–46.0)
HEMOGLOBIN: 12.7 g/dL (ref 12.0–15.0)
MCH: 26.1 pg (ref 26.0–34.0)
MCHC: 32.9 g/dL (ref 30.0–36.0)
MCV: 79.3 fL (ref 78.0–100.0)
PLATELETS: 237 10*3/uL (ref 150–400)
RBC: 4.87 MIL/uL (ref 3.87–5.11)
RDW: 14.1 % (ref 11.5–15.5)
WBC: 5.2 10*3/uL (ref 4.0–10.5)

## 2013-12-17 LAB — I-STAT TROPONIN, ED: TROPONIN I, POC: 0 ng/mL (ref 0.00–0.08)

## 2013-12-17 MED ORDER — PREDNISONE 20 MG PO TABS: 60.0000 mg | ORAL_TABLET | Freq: Once | ORAL | Status: DC

## 2013-12-17 MED ORDER — PREDNISONE 20 MG PO TABS
60.0000 mg | ORAL_TABLET | Freq: Once | ORAL | Status: AC
  Administered 2013-12-17: 60 mg via ORAL
  Filled 2013-12-17: qty 3

## 2013-12-17 MED ORDER — IPRATROPIUM-ALBUTEROL 0.5-2.5 (3) MG/3ML IN SOLN
3.0000 mL | Freq: Once | RESPIRATORY_TRACT | Status: AC
  Administered 2013-12-17: 3 mL via RESPIRATORY_TRACT
  Filled 2013-12-17: qty 3

## 2013-12-17 MED ORDER — PREDNISONE 50 MG PO TABS: 50.0000 mg | ORAL_TABLET | Freq: Every day | ORAL | Status: DC

## 2013-12-17 MED ORDER — IPRATROPIUM-ALBUTEROL 20-100 MCG/ACT IN AERS: 1.0000 | INHALATION_SPRAY | Freq: Four times a day (QID) | RESPIRATORY_TRACT | Status: AC | PRN

## 2013-12-17 NOTE — ED Notes (Signed)
Patient returned from X-ray 

## 2013-12-17 NOTE — ED Notes (Signed)
Pt also reports a "slight" headache. Pt was also prescribed Prdenisone

## 2013-12-17 NOTE — Discharge Instructions (Signed)
Acute Bronchitis Bronchitis is inflammation of the airways that extend from the windpipe into the lungs (bronchi). The inflammation often causes mucus to develop. This leads to a cough, which is the most common symptom of bronchitis.  In acute bronchitis, the condition usually develops suddenly and goes away over time, usually in a couple weeks. Smoking, allergies, and asthma can make bronchitis worse. Repeated episodes of bronchitis may cause further lung problems.  CAUSES Acute bronchitis is most often caused by the same virus that causes a cold. The virus can spread from person to person (contagious) through coughing, sneezing, and touching contaminated objects. SIGNS AND SYMPTOMS   Cough.   Fever.   Coughing up mucus.   Body aches.   Chest congestion.   Chills.   Shortness of breath.   Sore throat.  DIAGNOSIS  Acute bronchitis is usually diagnosed through a physical exam. Your health care provider will also ask you questions about your medical history. Tests, such as chest X-rays, are sometimes done to rule out other conditions.  TREATMENT  Acute bronchitis usually goes away in a couple weeks. Oftentimes, no medical treatment is necessary. Medicines are sometimes given for relief of fever or cough. Antibiotic medicines are usually not needed but may be prescribed in certain situations. In some cases, an inhaler may be recommended to help reduce shortness of breath and control the cough. A cool mist vaporizer may also be used to help thin bronchial secretions and make it easier to clear the chest.  HOME CARE INSTRUCTIONS  Get plenty of rest.   Drink enough fluids to keep your urine clear or pale yellow (unless you have a medical condition that requires fluid restriction). Increasing fluids may help thin your respiratory secretions (sputum) and reduce chest congestion, and it will prevent dehydration.   Take medicines only as directed by your health care provider.  If  you were prescribed an antibiotic medicine, finish it all even if you start to feel better.  Avoid smoking and secondhand smoke. Exposure to cigarette smoke or irritating chemicals will make bronchitis worse. If you are a smoker, consider using nicotine gum or skin patches to help control withdrawal symptoms. Quitting smoking will help your lungs heal faster.   Reduce the chances of another bout of acute bronchitis by washing your hands frequently, avoiding people with cold symptoms, and trying not to touch your hands to your mouth, nose, or eyes.   Keep all follow-up visits as directed by your health care provider.  SEEK MEDICAL CARE IF: Your symptoms do not improve after 1 week of treatment.  SEEK IMMEDIATE MEDICAL CARE IF:  You develop an increased fever or chills.   You have chest pain.   You have severe shortness of breath.  You have bloody sputum.   You develop dehydration.  You faint or repeatedly feel like you are going to pass out.  You develop repeated vomiting.  You develop a severe headache. MAKE SURE YOU:   Understand these instructions.  Will watch your condition.  Will get help right away if you are not doing well or get worse. Document Released: 02/03/2004 Document Revised: 05/12/2013 Document Reviewed: 06/18/2012 [facility] Patient Information 2015 [place], [place]. This information is not intended to replace advice given to you by your health care provider. Make sure you discuss any questions you have with your health care provider.  Albuterol; Ipratropium inhalation aerosol What is this medicine? ALBUTEROL; IPRATROPIUM (al BYOO ter ole; i pra TROE pee um) has two bronchodilators. It  helps open up the airways in your lungs to make it easier to breathe.This medicine is used to treat chronic obstructive pulmonary disease (COPD). This medicine may be used for other purposes; ask your health care provider or pharmacist if you have questions. COMMON BRAND  NAME(S): Combivent What should I tell my health care provider before I take this medicine? They need to know if you have any of the following conditions: -heart disease -high blood pressure -irregular heartbeat -an unusual or allergic reaction to albuterol, ipratropium, atropine, soya protein, soybeans or peanuts, other medicines, foods, dyes, or preservatives -pregnant or trying to get pregnant -breast-feeding How should I use this medicine? This medicine is for inhalation only. Follow the instructions on your prescription label. Do not use more often than directed. Make sure that you are using your inhaler correctly. Ask you doctor or health care provider if you have any questions. Talk to your pediatrician regarding the use of this medicine in children. Special care may be needed. Overdosage: If you think you have taken too much of this medicine contact a poison control center or emergency room at once. NOTE: This medicine is only for you. Do not share this medicine with others. What if I miss a dose? If you miss a dose, use it as soon as you can. If it is almost time for your next dose, use only that dose. Do not use double or extra doses. What may interact with this medicine? Do not take this medicine with any of the following medications: -MAOIs like Carbex, Eldepryl, Marplan, Nardil, and Parnate This medicine may also interact with the following medications: -diuretics -medicines for depression, anxiety, or psychotic disturbances -medicines for irregular heartbeat -medicines for weight loss including some herbal products -methadone -pimozide -sertindole -some medicines for blood pressure or the heart This list may not describe all possible interactions. Give your health care provider a list of all the medicines, herbs, non-prescription drugs, or dietary supplements you use. Also tell them if you smoke, drink alcohol, or use illegal drugs. Some items may interact with your  medicine. What should I watch for while using this medicine? Tell your doctor or health care professional if your symptoms do not improve. If your breathing gets worse while you are using this medicine, call your doctor right away. Do not stop using your medicine unless your doctor tells you to. Your mouth may get dry. Chewing sugarless gum or sucking hard candy, and drinking plenty of water may help. Contact your doctor if the problem does not go away or is severe. You may get dizzy or have blurred vision. Do not drive, use machinery, or do anything that needs mental alertness until you know how this medicine affects you. Do not stand or sit up quickly, especially if you are an older patient. This reduces the risk of dizzy or fainting spells. What side effects may I notice from receiving this medicine? Side effects that you should report to your doctor or health care professional as soon as possible: -allergic reactions like skin rash, itching or hives, swelling of the face, lips, or tongue -breathing problems -feeling faint or lightheaded, falls -fever -high blood pressure -irregular heartbeat or chest pain -muscle cramps or weakness -pain, tingling, numbness in the hands or feet -vomiting Side effects that usually do not require medical attention (report to your doctor or health care professional if they continue or are bothersome): -blurred vision -cough -difficulty passing urine -difficulty sleeping -headache -nervousness or trembling -stuffy or runny nose -  unusual taste -upset stomach This list may not describe all possible side effects. Call your doctor for medical advice about side effects. You may report side effects to FDA at 1-800-FDA-1088. Where should I keep my medicine? Keep out of the reach of children. Store at a room temperature between 15 and 30 degrees C (59 and 86 degrees F). Do not freeze. This medicine does not work as well if it is too cold. Protect from humidity.  Never throw the container into a fire or incinerator. Keep track of the number of sprays used and discard after 200 sprays. Throw away any unused medicine after the expiration date. NOTE: This sheet is a summary. It may not cover all possible information. If you have questions about this medicine, talk to your doctor, pharmacist, or health care provider.  2015, [employer] Standard. (2012-06-07 08:58:39)  Prednisone tablets What is this medicine? PREDNISONE (PRED ni sone) is a corticosteroid. It is commonly used to treat inflammation of the skin, joints, lungs, and other organs. Common conditions treated include asthma, allergies, and arthritis. It is also used for other conditions, such as blood disorders and diseases of the adrenal glands. This medicine may be used for other purposes; ask your health care provider or pharmacist if you have questions. COMMON BRAND NAME(S): Deltasone, Predone, Sterapred, Sterapred DS What should I tell my health care provider before I take this medicine? They need to know if you have any of these conditions: -Cushing's syndrome -diabetes -glaucoma -heart disease -high blood pressure -infection (especially a virus infection such as chickenpox, cold sores, or herpes) -kidney disease -liver disease -mental illness -myasthenia gravis -osteoporosis -seizures -stomach or intestine problems -thyroid disease -an unusual or allergic reaction to lactose, prednisone, other medicines, foods, dyes, or preservatives -pregnant or trying to get pregnant -breast-feeding How should I use this medicine? Take this medicine by mouth with a glass of water. Follow the directions on the prescription label. Take this medicine with food. If you are taking this medicine once a day, take it in the morning. Do not take more medicine than you are told to take. Do not suddenly stop taking your medicine because you may develop a severe reaction. Your doctor will tell you how much  medicine to take. If your doctor wants you to stop the medicine, the dose may be slowly lowered over time to avoid any side effects. Talk to your pediatrician regarding the use of this medicine in children. Special care may be needed. Overdosage: If you think you have taken too much of this medicine contact a poison control center or emergency room at once. NOTE: This medicine is only for you. Do not share this medicine with others. What if I miss a dose? If you miss a dose, take it as soon as you can. If it is almost time for your next dose, talk to your doctor or health care professional. You may need to miss a dose or take an extra dose. Do not take double or extra doses without advice. What may interact with this medicine? Do not take this medicine with any of the following medications: -metyrapone -mifepristone This medicine may also interact with the following medications: -aminoglutethimide -amphotericin B -aspirin and aspirin-like medicines -barbiturates -certain medicines for diabetes, like glipizide or glyburide -cholestyramine -cholinesterase inhibitors -cyclosporine -digoxin -diuretics -ephedrine -female hormones, like estrogens and birth control pills -isoniazid -ketoconazole -NSAIDS, medicines for pain and inflammation, like ibuprofen or naproxen -phenytoin -rifampin -toxoids -vaccines -warfarin This list may not describe all possible  interactions. Give your health care provider a list of all the medicines, herbs, non-prescription drugs, or dietary supplements you use. Also tell them if you smoke, drink alcohol, or use illegal drugs. Some items may interact with your medicine. What should I watch for while using this medicine? Visit your doctor or health care professional for regular checks on your progress. If you are taking this medicine over a prolonged period, carry an identification card with your name and address, the type and dose of your medicine, and your  doctor's name and address. This medicine may increase your risk of getting an infection. Tell your doctor or health care professional if you are around anyone with measles or chickenpox, or if you develop sores or blisters that do not heal properly. If you are going to have surgery, tell your doctor or health care professional that you have taken this medicine within the last twelve months. Ask your doctor or health care professional about your diet. You may need to lower the amount of salt you eat. This medicine may affect blood sugar levels. If you have diabetes, check with your doctor or health care professional before you change your diet or the dose of your diabetic medicine. What side effects may I notice from receiving this medicine? Side effects that you should report to your doctor or health care professional as soon as possible: -allergic reactions like skin rash, itching or hives, swelling of the face, lips, or tongue -changes in emotions or moods -changes in vision -depressed mood -eye pain -fever or chills, cough, sore throat, pain or difficulty passing urine -increased thirst -swelling of ankles, feet Side effects that usually do not require medical attention (report to your doctor or health care professional if they continue or are bothersome): -confusion, excitement, restlessness -headache -nausea, vomiting -skin problems, acne, thin and shiny skin -trouble sleeping -weight gain This list may not describe all possible side effects. Call your doctor for medical advice about side effects. You may report side effects to FDA at 1-800-FDA-1088. Where should I keep my medicine? Keep out of the reach of children. Store at room temperature between 15 and 30 degrees C (59 and 86 degrees F). Protect from light. Keep container tightly closed. Throw away any unused medicine after the expiration date. NOTE: This sheet is a summary. It may not cover all possible information. If you have  questions about this medicine, talk to your doctor, pharmacist, or health care provider.  2015, [employer] Standard. (2010-08-11 10:57:14)

## 2013-12-17 NOTE — ED Notes (Signed)
Patient transported to X-ray 

## 2013-12-17 NOTE — ED Notes (Signed)
Pt presents with Left side chest pain, diaphoresis, and SOB x1 day. Pt recently prescribed ABX for a productive last week.

## 2013-12-17 NOTE — ED Provider Notes (Signed)
CSN: [id number]     Arrival date & time 12/17/13  0626 History   First MD Initiated Contact with Patient 12/17/13 [phone]     Chief Complaint  Patient presents with  . Chest Pain     (Consider location/radiation/quality/duration/timing/severity/associated sxs/prior Treatment) Patient is a 43 y.o. female presenting with chest pain. The history is provided by the patient.  Chest Pain She has had a cough for the last 3 weeks. Cough is productive of a small amount of clear sputum. She denies fever, chills, sweats. She has had some soreness in her chest related coughing. She states that she is only short of breath during a coughing spasm. She treated herself with over-the-counter cough medicine with no relief. She then saw her physician who tried her on a Z-Pak with no relief and then gave her a course of prednisone with an inhaler which to give partial relief. She has finished the course of prednisone and symptoms started getting worse again. She thinks her prednisone dose was 20 mg a day for 5 days. She is a cigarette smoker but states that she has not been smoking since she got sick.  Past Medical History  Diagnosis Date  . Anxiety   . Hypertension    Past Surgical History  Procedure Laterality Date  . Wisdom tooth extraction     Family History  Problem Relation Age of Onset  . Hypertension Mother   . Hypertension Father    History  Substance Use Topics  . Smoking status: Current Every Day Smoker -- 0.30 packs/day for 20 years  . Smokeless tobacco: Never Used     Comment: notes sometimes smokes one to two cigarettes daily  . Alcohol Use: Yes     Comment: wine   OB History    No data available     Review of Systems  Cardiovascular: Positive for chest pain.  All other systems reviewed and are negative.     Allergies  Latex  Home Medications   Prior to Admission medications   Medication Sig Start Date End Date Taking? Authorizing Provider  Aspirin-Salicylamide-Caffeine  (BC HEADACHE POWDER PO) Take 1 Package by mouth daily as needed. For pain    Historical Provider, MD  diclofenac (VOLTAREN) 75 MG EC tablet Take 75 mg by mouth 2 (two) times daily.    Historical Provider, MD  ibuprofen (ADVIL,MOTRIN) 200 MG tablet Take 200 mg by mouth every 6 (six) hours as needed. For pain    Historical Provider, MD  lisinopril (PRINIVIL,ZESTRIL) 10 MG tablet Take 10 mg by mouth daily.    Historical Provider, MD   BP 131/89 mmHg  Pulse 87  Temp(Src) 98.2 F (36.8 C) (Oral)  Resp 16  Ht 5\' 8"  (1.727 m)  Wt 165 lb (74.844 kg)  BMI 25.09 kg/m2  SpO2 99%  LMP 06/25/2011 Physical Exam  Nursing note and vitals reviewed.  43 year old female, resting comfortably and in no acute distress. Vital signs are normal. Oxygen saturation is 99%, which is normal. Head is normocephalic and atraumatic. PERRLA, EOMI. Oropharynx is clear. Neck is nontender and supple without adenopathy or JVD. Back is nontender and there is no CVA tenderness. Lungs are clear without rales, wheezes, or rhonchi.  There is slightly prolonged exhalation phase, and when she coughs, cough as significant wheezy component. Chest is moderately tender bilaterally in the parasternal area. This does reproduce her pain. Heart has regular rate and rhythm without murmur. Abdomen is soft, flat, nontender without masses or hepatosplenomegaly and  peristalsis is normoactive. Extremities have no cyanosis or edema, full range of motion is present. Skin is warm and dry without rash. Neurologic: Mental status is normal, cranial nerves are intact, there are no motor or sensory deficits.  ED Course  Procedures (including critical care time) Labs Review Results for orders placed or performed during the hospital encounter of 12/17/13  CBC  Result Value Ref Range   WBC 5.2 4.0 - 10.5 K/uL   RBC 4.87 3.87 - 5.11 MIL/uL   Hemoglobin 12.7 12.0 - 15.0 g/dL   HCT 38.6 36.0 - 46.0 %   MCV 79.3 78.0 - 100.0 fL   MCH 26.1 26.0 -  34.0 pg   MCHC 32.9 30.0 - 36.0 g/dL   RDW 14.1 11.5 - 15.5 %   Platelets 237 150 - 400 K/uL  Basic metabolic panel  Result Value Ref Range   Sodium 140 137 - 147 mEq/L   Potassium 4.0 3.7 - 5.3 mEq/L   Chloride 104 96 - 112 mEq/L   CO2 21 19 - 32 mEq/L   Glucose, Bld 88 70 - 99 mg/dL   BUN 16 6 - 23 mg/dL   Creatinine, Ser 0.83 0.50 - 1.10 mg/dL   Calcium 8.9 8.4 - 10.5 mg/dL   GFR calc non Af Amer 85 (L) >90 mL/min   GFR calc Af Amer >90 >90 mL/min   Anion gap 15 5 - 15  I-stat troponin, ED (not at [facility])  Result Value Ref Range   Troponin i, poc 0.00 0.00 - 0.08 ng/mL   Comment 3           Imaging Review Dg Chest 2 View  12/17/2013   CLINICAL DATA:  LEFT chest pain, cough, chest tightness for 3 weeks.  EXAM: CHEST  2 VIEW  COMPARISON:  Chest radiograph December 25, 2011  FINDINGS: Cardiomediastinal silhouette is unremarkable. Minimal bronchitic changes. The lungs are clear without pleural effusions or focal consolidations. Trachea projects midline and there is no pneumothorax. Soft tissue planes and included osseous structures are non-suspicious.  IMPRESSION: Similar minimal bronchitic changes without focal consolidation.   Electronically Signed   By: Elon Alas   On: 12/17/2013 05:00     EKG Interpretation   Date/Time:  Wednesday December 17 2013 04:17:26 EST Ventricular Rate:  92 PR Interval:  166 QRS Duration: 74 QT Interval:  374 QTC Calculation: 462 R Axis:   64 Text Interpretation:  Normal sinus rhythm Normal ECG When compared with  ECG of 12/26/2011, No significant change was found Confirmed by [facility]  MD,  Lexine Jaspers ([id number]) on 12/17/2013 5:29:09 AM      MDM   Final diagnoses:  Chest pain  Acute bronchitis due to other specified organisms    Chest wall pain secondary to bronchitis with persistent cough. Reassured no evidence of pneumonia, and WBC is normal. I suspect that she was on too low of a dose of prednisone. She also may benefit from hypertrophic.  She'll be given a nebulizer treatment with ipratropium and albuterol and a dose of prednisone.  She had considerable relief with ipratropium less albuterol. She is discharged with prescription for Combivent inhaler and prednisone. Follow-up with PCP after completion of the course of prednisone.  Delora Fuel, MD 95/28/41 [id number]

## 2014-01-05 ENCOUNTER — Encounter: Payer: Self-pay | Admitting: *Deleted

## 2014-01-06 ENCOUNTER — Encounter: Payer: Self-pay | Admitting: Obstetrics & Gynecology

## 2015-01-21 ENCOUNTER — Encounter: Payer: Self-pay | Admitting: Gynecology

## 2015-10-18 ENCOUNTER — Ambulatory Visit: Payer: 59 | Admitting: Skilled Nursing Facility1

## 2015-10-23 ENCOUNTER — Emergency Department (HOSPITAL_COMMUNITY)
Admission: EM | Admit: 2015-10-23 | Discharge: 2015-10-23 | Disposition: A | Discharge status: Home / Self Care | Payer: 59 | Source: Home / Self Care | Attending: Emergency Medicine | Admitting: Emergency Medicine

## 2015-10-23 ENCOUNTER — Emergency Department (HOSPITAL_COMMUNITY): Payer: 59

## 2015-10-23 ENCOUNTER — Inpatient Hospital Stay (HOSPITAL_COMMUNITY)
Admission: EM | Admit: 2015-10-23 | Discharge: 2015-10-25 | DRG: 195 | Disposition: A | Discharge status: Home / Self Care | Payer: 59 | Attending: Internal Medicine | Admitting: Internal Medicine

## 2015-10-23 ENCOUNTER — Encounter (HOSPITAL_COMMUNITY): Payer: Self-pay | Admitting: *Deleted

## 2015-10-23 ENCOUNTER — Encounter (HOSPITAL_COMMUNITY): Payer: Self-pay

## 2015-10-23 DIAGNOSIS — D1803 Hemangioma of intra-abdominal structures: Secondary | ICD-10-CM | POA: Diagnosis present

## 2015-10-23 DIAGNOSIS — I10 Essential (primary) hypertension: Secondary | ICD-10-CM | POA: Diagnosis present

## 2015-10-23 DIAGNOSIS — Z8249 Family history of ischemic heart disease and other diseases of the circulatory system: Secondary | ICD-10-CM | POA: Diagnosis not present

## 2015-10-23 DIAGNOSIS — F17210 Nicotine dependence, cigarettes, uncomplicated: Secondary | ICD-10-CM | POA: Diagnosis present

## 2015-10-23 DIAGNOSIS — E876 Hypokalemia: Secondary | ICD-10-CM

## 2015-10-23 DIAGNOSIS — K769 Liver disease, unspecified: Secondary | ICD-10-CM

## 2015-10-23 DIAGNOSIS — R9431 Abnormal electrocardiogram [ECG] [EKG]: Secondary | ICD-10-CM | POA: Diagnosis present

## 2015-10-23 DIAGNOSIS — K219 Gastro-esophageal reflux disease without esophagitis: Secondary | ICD-10-CM | POA: Diagnosis present

## 2015-10-23 DIAGNOSIS — G43A1 Cyclical vomiting, intractable: Secondary | ICD-10-CM | POA: Diagnosis not present

## 2015-10-23 DIAGNOSIS — R1110 Vomiting, unspecified: Secondary | ICD-10-CM

## 2015-10-23 DIAGNOSIS — Z91040 Latex allergy status: Secondary | ICD-10-CM | POA: Insufficient documentation

## 2015-10-23 DIAGNOSIS — F17200 Nicotine dependence, unspecified, uncomplicated: Secondary | ICD-10-CM

## 2015-10-23 DIAGNOSIS — F419 Anxiety disorder, unspecified: Secondary | ICD-10-CM | POA: Diagnosis present

## 2015-10-23 DIAGNOSIS — Z79899 Other long term (current) drug therapy: Secondary | ICD-10-CM

## 2015-10-23 DIAGNOSIS — R197 Diarrhea, unspecified: Secondary | ICD-10-CM

## 2015-10-23 DIAGNOSIS — J189 Pneumonia, unspecified organism: Secondary | ICD-10-CM | POA: Diagnosis not present

## 2015-10-23 DIAGNOSIS — R112 Nausea with vomiting, unspecified: Secondary | ICD-10-CM | POA: Diagnosis present

## 2015-10-23 DIAGNOSIS — R6510 Systemic inflammatory response syndrome (SIRS) of non-infectious origin without acute organ dysfunction: Secondary | ICD-10-CM

## 2015-10-23 DIAGNOSIS — Z9889 Other specified postprocedural states: Secondary | ICD-10-CM | POA: Diagnosis not present

## 2015-10-23 LAB — CBC WITH DIFFERENTIAL/PLATELET
Basophils Absolute: 0 10*3/uL (ref 0.0–0.1)
Basophils Absolute: 0 10*3/uL (ref 0.0–0.1)
Basophils Relative: 0 %
Basophils Relative: 0 %
EOS ABS: 0 10*3/uL (ref 0.0–0.7)
Eosinophils Absolute: 0 10*3/uL (ref 0.0–0.7)
Eosinophils Relative: 0 %
Eosinophils Relative: 0 %
HEMATOCRIT: 38.5 % (ref 36.0–46.0)
HEMATOCRIT: 37.1 % (ref 36.0–46.0)
HEMOGLOBIN: 12.3 g/dL (ref 12.0–15.0)
Hemoglobin: 13 g/dL (ref 12.0–15.0)
LYMPHS ABS: 1 10*3/uL (ref 0.7–4.0)
LYMPHS ABS: 1.5 10*3/uL (ref 0.7–4.0)
LYMPHS PCT: 4 %
LYMPHS PCT: 8 %
MCH: 26.3 pg (ref 26.0–34.0)
MCH: 26.2 pg (ref 26.0–34.0)
MCHC: 33.8 g/dL (ref 30.0–36.0)
MCHC: 33.2 g/dL (ref 30.0–36.0)
MCV: 77.8 fL — ABNORMAL LOW (ref 78.0–100.0)
MCV: 79.1 fL (ref 78.0–100.0)
MONOS PCT: 3 %
Monocytes Absolute: 0.8 10*3/uL (ref 0.1–1.0)
Monocytes Absolute: 0.6 10*3/uL (ref 0.1–1.0)
Monocytes Relative: 3 %
NEUTROS ABS: 15.8 10*3/uL — AB (ref 1.7–7.7)
NEUTROS PCT: 89 %
Neutro Abs: 23.3 10*3/uL — ABNORMAL HIGH (ref 1.7–7.7)
Neutrophils Relative %: 93 %
Platelets: 274 10*3/uL (ref 150–400)
Platelets: 252 10*3/uL (ref 150–400)
RBC: 4.95 MIL/uL (ref 3.87–5.11)
RBC: 4.69 MIL/uL (ref 3.87–5.11)
RDW: 14.3 % (ref 11.5–15.5)
RDW: 14.3 % (ref 11.5–15.5)
WBC: 25.1 10*3/uL — AB (ref 4.0–10.5)
WBC: 17.9 10*3/uL — AB (ref 4.0–10.5)

## 2015-10-23 LAB — BASIC METABOLIC PANEL
Anion gap: 11 (ref 5–15)
BUN: 5 mg/dL — AB (ref 6–20)
CHLORIDE: 105 mmol/L (ref 101–111)
CO2: 22 mmol/L (ref 22–32)
Calcium: 8.8 mg/dL — ABNORMAL LOW (ref 8.9–10.3)
Creatinine, Ser: 0.85 mg/dL (ref 0.44–1.00)
GFR calc Af Amer: >60 mL/min (ref >60)
GFR calc non Af Amer: >60 mL/min (ref >60)
Glucose, Bld: 130 mg/dL — ABNORMAL HIGH (ref 65–99)
POTASSIUM: 3 mmol/L — AB (ref 3.5–5.1)
SODIUM: 138 mmol/L (ref 135–145)

## 2015-10-23 LAB — COMPREHENSIVE METABOLIC PANEL
ALBUMIN: 4 g/dL (ref 3.5–5.0)
ALT: 23 U/L (ref 14–54)
AST: 23 U/L (ref 15–41)
Alkaline Phosphatase: 88 U/L (ref 38–126)
Anion gap: 10 (ref 5–15)
BILIRUBIN TOTAL: 0.9 mg/dL (ref 0.3–1.2)
BUN: 5 mg/dL — AB (ref 6–20)
CALCIUM: 9.4 mg/dL (ref 8.9–10.3)
CO2: 24 mmol/L (ref 22–32)
Chloride: 104 mmol/L (ref 101–111)
Creatinine, Ser: 0.83 mg/dL (ref 0.44–1.00)
GFR calc Af Amer: >60 mL/min (ref >60)
GFR calc non Af Amer: >60 mL/min (ref >60)
GLUCOSE: 214 mg/dL — AB (ref 65–99)
POTASSIUM: 3.4 mmol/L — AB (ref 3.5–5.1)
Sodium: 138 mmol/L (ref 135–145)
TOTAL PROTEIN: 7.2 g/dL (ref 6.5–8.1)

## 2015-10-23 LAB — LIPASE, BLOOD: LIPASE: 15 U/L (ref 11–51)

## 2015-10-23 LAB — URINALYSIS, ROUTINE W REFLEX MICROSCOPIC
BILIRUBIN URINE: NEGATIVE
GLUCOSE, UA: 100 mg/dL — AB
KETONES UR: 15 mg/dL — AB
Leukocytes, UA: NEGATIVE
Nitrite: NEGATIVE
Protein, ur: NEGATIVE mg/dL
Specific Gravity, Urine: 1.02 (ref 1.005–1.030)
pH: 8 (ref 5.0–8.0)

## 2015-10-23 LAB — I-STAT BETA HCG BLOOD, ED (MC, WL, AP ONLY)

## 2015-10-23 LAB — I-STAT TROPONIN, ED: TROPONIN I, POC: 0 ng/mL (ref 0.00–0.08)

## 2015-10-23 LAB — URINE MICROSCOPIC-ADD ON: Bacteria, UA: NONE SEEN

## 2015-10-23 LAB — I-STAT CG4 LACTIC ACID, ED: LACTIC ACID, VENOUS: 1.85 mmol/L (ref 0.5–1.9)

## 2015-10-23 LAB — MAGNESIUM: Magnesium: 1.9 mg/dL (ref 1.7–2.4)

## 2015-10-23 MED ORDER — DEXTROSE 5 % IV SOLN
1.0000 g | Freq: Once | INTRAVENOUS | Status: AC
  Administered 2015-10-23: 1 g via INTRAVENOUS
  Filled 2015-10-23: qty 10

## 2015-10-23 MED ORDER — ONDANSETRON HCL 4 MG/2ML IJ SOLN
4.0000 mg | Freq: Once | INTRAMUSCULAR | Status: AC
  Administered 2015-10-23: 4 mg via INTRAVENOUS
  Filled 2015-10-23: qty 2

## 2015-10-23 MED ORDER — PROMETHAZINE HCL 25 MG/ML IJ SOLN
12.5000 mg | Freq: Once | INTRAMUSCULAR | Status: AC
  Administered 2015-10-23: 12.5 mg via INTRAVENOUS
  Filled 2015-10-23: qty 1

## 2015-10-23 MED ORDER — MORPHINE SULFATE (PF) 4 MG/ML IV SOLN
4.0000 mg | Freq: Once | INTRAVENOUS | Status: DC
  Filled 2015-10-23: qty 1

## 2015-10-23 MED ORDER — DEXTROSE 5 % IV SOLN
500.0000 mg | Freq: Once | INTRAVENOUS | Status: AC
  Administered 2015-10-23: 500 mg via INTRAVENOUS
  Filled 2015-10-23: qty 500

## 2015-10-23 MED ORDER — SODIUM CHLORIDE 0.9 % IV BOLUS (SEPSIS)
1000.0000 mL | Freq: Once | INTRAVENOUS | Status: AC
  Administered 2015-10-23: 1000 mL via INTRAVENOUS

## 2015-10-23 MED ORDER — ONDANSETRON 4 MG PO TBDP
ORAL_TABLET | ORAL | Status: AC
  Filled 2015-10-23: qty 2

## 2015-10-23 MED ORDER — FAMOTIDINE IN NACL 20-0.9 MG/50ML-% IV SOLN
20.0000 mg | Freq: Two times a day (BID) | INTRAVENOUS | Status: DC
  Administered 2015-10-24 – 2015-10-25 (×4): 20 mg via INTRAVENOUS
  Filled 2015-10-23 (×5): qty 50

## 2015-10-23 MED ORDER — IOPAMIDOL (ISOVUE-370) INJECTION 76%
INTRAVENOUS | Status: AC
  Administered 2015-10-23: 100 mL
  Filled 2015-10-23: qty 100

## 2015-10-23 MED ORDER — HYDROMORPHONE HCL 1 MG/ML IJ SOLN
1.0000 mg | Freq: Once | INTRAMUSCULAR | Status: AC
  Administered 2015-10-23: 1 mg via INTRAVENOUS
  Filled 2015-10-23: qty 1

## 2015-10-23 MED ORDER — MORPHINE SULFATE (PF) 4 MG/ML IV SOLN
4.0000 mg | Freq: Once | INTRAVENOUS | Status: AC
  Administered 2015-10-23: 4 mg via INTRAVENOUS
  Filled 2015-10-23: qty 1

## 2015-10-23 MED ORDER — ONDANSETRON HCL 4 MG/2ML IJ SOLN
4.0000 mg | Freq: Once | INTRAMUSCULAR | Status: AC
Start: 2015-10-23 — End: 2015-10-23
  Administered 2015-10-23: 4 mg via INTRAVENOUS
  Filled 2015-10-23: qty 2

## 2015-10-23 MED ORDER — METOCLOPRAMIDE HCL 5 MG/ML IJ SOLN
10.0000 mg | Freq: Once | INTRAMUSCULAR | Status: AC
  Administered 2015-10-23: 10 mg via INTRAVENOUS
  Filled 2015-10-23: qty 2

## 2015-10-23 MED ORDER — METOCLOPRAMIDE HCL 5 MG/ML IJ SOLN
10.0000 mg | Freq: Once | INTRAMUSCULAR | Status: AC
Start: 2015-10-23 — End: 2015-10-23
  Administered 2015-10-23: 10 mg via INTRAVENOUS
  Filled 2015-10-23: qty 2

## 2015-10-23 MED ORDER — ONDANSETRON 4 MG PO TBDP
8.0000 mg | ORAL_TABLET | Freq: Once | ORAL | Status: AC
  Administered 2015-10-23: 8 mg via ORAL

## 2015-10-23 MED ORDER — PROMETHAZINE HCL 25 MG PO TABS: 25.0000 mg | ORAL_TABLET | Freq: Four times a day (QID) | ORAL | 0 refills | Status: DC | PRN

## 2015-10-23 MED ORDER — POTASSIUM CHLORIDE 10 MEQ/100ML IV SOLN
10.0000 meq | Freq: Once | INTRAVENOUS | Status: AC
  Administered 2015-10-24: 10 meq via INTRAVENOUS
  Filled 2015-10-23: qty 100

## 2015-10-23 MED ORDER — DIPHENHYDRAMINE HCL 50 MG/ML IJ SOLN
50.0000 mg | Freq: Once | INTRAMUSCULAR | Status: AC
  Administered 2015-10-23: 50 mg via INTRAVENOUS
  Filled 2015-10-23: qty 1

## 2015-10-23 MED ORDER — HYDROCODONE-HOMATROPINE 5-1.5 MG/5ML PO SYRP: 5.0000 mL | ORAL_SOLUTION | Freq: Four times a day (QID) | ORAL | 0 refills | Status: DC | PRN

## 2015-10-23 MED ORDER — LEVOFLOXACIN 750 MG PO TABS: 750.0000 mg | ORAL_TABLET | Freq: Every day | ORAL | 0 refills | Status: DC

## 2015-10-23 NOTE — Discharge Instructions (Signed)
Read the information below.  Use the prescribed medication as directed.  Please discuss all new medications with your pharmacist.  You may return to the Emergency Department at any time for worsening condition or any new symptoms that concern you.     If you develop worsening chest pain, shortness of breath, fever, you pass out, uncontrolled vomiting, or become weak or dizzy, return to the ER for a recheck.

## 2015-10-23 NOTE — H&P (Signed)
History and Physical    Rebekah [name] [id number] DOB: Jan 17, 1970 DOA: 10/23/2015  PCP: Annye Asa, MD   Patient coming from: Home  Chief Complaint: Nausea and vomiting.  HPI: Rebekah [name] is a 45 y.o. female with medical history significant of anxiety, GERD, hypertension who is coming for the second time today to the emergency department due to persistent nausea, vomiting and cough.  Per patient, she has not been feeling well lately. She has been fatigued and anxious. She had a very good friend who passed away this week. She states that she had a panic attack and passed out when she heard the news of her death. She states that since last night evening she has had more than 30 episodes of nausea/emesis and more than 10 episodes of emesis. She also complains of dyspnea and palpitations. She was discharge home this morning from the ER, but returned after the emesis recurred and she vomited the contents of the prescriptions given to her earlier. She denies travel history or sick contacts.   ED Course: The patient received metoclopramide 10 mg, hydromorphone 1 mg,  a normal saline 1 L bolus, famotidine 20 mg, phenergan 12.5 mg, Zofran 4 mg and benadryl 50 mg IVP. She also received ceftriaxone and azithromycin and KCl 10 mEq IVPB.   Workup showed WBC of 17.9, potassium 3.0 mmol/L, glucose 130 mg/dL, lactic acid was normal. Imaging shows bilateral patchy infiltrates and a nonspecific 1.7 cm right hepatic mass. (MRI recommended and ordered)  Review of Systems: As per HPI otherwise 10 point review of systems negative.    Past Medical History:  Diagnosis Date  . Anxiety   . Hypertension     Past Surgical History:  Procedure Laterality Date  . WISDOM TOOTH EXTRACTION       reports that she has been smoking.  She has a 6.00 pack-year smoking history. She has never used smokeless tobacco. She reports that she drinks alcohol. She reports that she does not use drugs.  Allergies    Allergen Reactions  . Latex Rash    Family History  Problem Relation Age of Onset  . Hypertension Mother   . Hypertension Father     Prior to Admission medications   Medication Sig Start Date End Date Taking? Authorizing Provider  amLODipine (NORVASC) 5 MG tablet Take 5 mg by mouth at bedtime.    Yes Historical Provider, MD  [employer] VITAMIN D 2000 units CAPS Take 2,000 Units by mouth daily. 09/22/15  Yes Historical Provider, MD  diazepam (VALIUM) 5 MG tablet Take 5 mg by mouth 2 (two) times daily as needed for anxiety.  10/22/15  Yes Historical Provider, MD  diclofenac (VOLTAREN) 75 MG EC tablet Take 75 mg by mouth 2 (two) times daily as needed (knee pain).    Yes Historical Provider, MD  Diclofenac Sodium 3 % GEL Apply 1 application topically 2 (two) times daily as needed (knee pain).  07/20/15  Yes Historical Provider, MD  HYDROcodone-homatropine (HYCODAN) 5-1.5 MG/5ML syrup Take 5 mLs by mouth every 6 (six) hours as needed for cough. 10/23/15  Yes Clayton Bibles, PA-C  ibuprofen (ADVIL,MOTRIN) 200 MG tablet Take 200-400 mg by mouth every 6 (six) hours as needed for headache (pain).    Yes Historical Provider, MD  levofloxacin (LEVAQUIN) 750 MG tablet Take 1 tablet (750 mg total) by mouth daily. 10/23/15  Yes Clayton Bibles, PA-C  loratadine (CLARITIN) 10 MG tablet Take 10 mg by mouth daily as needed for allergies.  10/08/15  Yes Historical Provider, MD  promethazine (PHENERGAN) 25 MG tablet Take 1 tablet (25 mg total) by mouth every 6 (six) hours as needed for nausea. 10/23/15  Yes Clayton Bibles, PA-C  traMADol (ULTRAM) 50 MG tablet Take 100 mg by mouth every 6 (six) hours as needed (for pain).   Yes Historical Provider, MD  Ipratropium-Albuterol (COMBIVENT RESPIMAT) 20-100 MCG/ACT AERS respimat Inhale 1 puff into the lungs every 6 (six) hours as needed for wheezing or shortness of breath (or coughing). Patient not taking: Reported on 10/23/2015 [id number]   Delora Fuel, MD    Physical  Exam:  Constitutional: NAD, calm, comfortable Vitals:   10/23/15 2247 10/23/15 2249 10/23/15 2250 10/23/15 2256  BP: (!) 150/105   (!) 150/105  Pulse:  110 97 90  Resp:    16  Temp:      SpO2:  100% 98% 96%   Eyes: PERRL, lids and conjunctivae normal ENMT: Mucous membranes are mildly dry. Posterior pharynx clear of any exudate or lesions. Neck: normal, supple, no masses, no thyromegaly Respiratory: Decreased breath sounds, crackles and mild roncchi bilaterally. Normal respiratory effort. No accessory muscle use.  Cardiovascular: tachycardic at 104 bpm, no murmurs / rubs / gallops. No extremity edema. 2+ pedal pulses. No carotid bruits.  Abdomen: Soft, mild diffuse tenderness, no guarding/rebound/masses palpated. No hepatosplenomegaly. Bowel sounds positive.  Musculoskeletal: no clubbing / cyanosis. No joint deformity upper and lower extremities. Good ROM, no contractures. Normal muscle tone.  Skin: no rashes, lesions, ulcers on limited skin exam. Neurologic: CN 2-12 grossly intact. Sensation intact, DTR normal. Strength 5/5 in all 4.  Psychiatric: Normal judgment and insight. Alert and oriented x 4. Normal mood.    Labs on Admission: I have personally reviewed following labs and imaging studies  CBC:  Recent Labs Lab 10/23/15 0604 10/23/15 2112  WBC 25.1* 17.9*  NEUTROABS 23.3* 15.8*  HGB 13.0 12.3  HCT 38.5 37.1  MCV 77.8* 79.1  PLT 274 [id number]   Basic Metabolic Panel:  Recent Labs Lab 10/23/15 0604 10/23/15 2112  NA 138 138  K 3.4* 3.0*  CL 104 105  CO2 24 22  GLUCOSE 214* 130*  BUN 5* 5*  CREATININE 0.83 0.85  CALCIUM 9.4 8.8*   GFR: CrCl cannot be calculated (Unknown ideal weight.). Liver Function Tests:  Recent Labs Lab 10/23/15 0604  AST 23  ALT 23  ALKPHOS 88  BILITOT 0.9  PROT 7.2  ALBUMIN 4.0    Recent Labs Lab 10/23/15 0604  LIPASE 15   No results for input(s): AMMONIA in the last 168 hours. Coagulation Profile: No results for input(s):  INR, PROTIME in the last 168 hours. Cardiac Enzymes: No results for input(s): CKTOTAL, CKMB, CKMBINDEX, TROPONINI in the last 168 hours. BNP (last 3 results) No results for input(s): PROBNP in the last 8760 hours. HbA1C: No results for input(s): HGBA1C in the last 72 hours. CBG: No results for input(s): GLUCAP in the last 168 hours. Lipid Profile: No results for input(s): CHOL, HDL, LDLCALC, TRIG, CHOLHDL, LDLDIRECT in the last 72 hours. Thyroid Function Tests: No results for input(s): TSH, T4TOTAL, FREET4, T3FREE, THYROIDAB in the last 72 hours. Anemia Panel: No results for input(s): VITAMINB12, FOLATE, FERRITIN, TIBC, IRON, RETICCTPCT in the last 72 hours. Urine analysis:    Component Value Date/Time   COLORURINE YELLOW 10/23/2015 1046   APPEARANCEUR CLEAR 10/23/2015 1046   LABSPEC 1.020 10/23/2015 1046   PHURINE 8.0 10/23/2015 1046   GLUCOSEU 100 (A) 10/23/2015 1046   HGBUR  SMALL (A) 10/23/2015 1046   BILIRUBINUR NEGATIVE 10/23/2015 1046   KETONESUR 15 (A) 10/23/2015 1046   PROTEINUR NEGATIVE 10/23/2015 1046   UROBILINOGEN 0.2 09/15/2008 0916   NITRITE NEGATIVE 10/23/2015 1046   LEUKOCYTESUR NEGATIVE 10/23/2015 1046    Radiological Exams on Admission: Dg Abdomen Acute W/chest  Result Date: 10/23/2015 CLINICAL DATA:  Midsternal chest pain and epigastric pain with emesis and diarrhea since last night. EXAM: DG ABDOMEN ACUTE W/ 1V CHEST COMPARISON:  Chest radiograph 12/17/2013. FINDINGS: Trachea is midline. Heart size normal. There is a new nodular opacity in the left midlung zone. Probable asymmetric prominence of the right costochondral junction, rather than an additional nodule. Lungs are otherwise clear. No pleural fluid. Upright and supine views of the abdomen show no free air. There may be minimal gaseous prominence of the colon. No small bowel dilatation. No unexpected radiopaque calculi. IMPRESSION: 1. Minimal gaseous prominence of the colon, nonspecific. No air-fluid  levels or small bowel obstruction. 2. New nodular density in the left midlung zone. Malignancy cannot be excluded. Nonemergent CT chest without contrast is recommended in further evaluation. These results will be called to the ordering clinician or representative by the Radiologist Assistant, and communication documented in the PACS or zVision Dashboard. Electronically Signed   By: Lorin Picket M.D.   On: 10/23/2015 08:25   Ct Angio Chest/abd/pel For Dissection W And/or Wo Contrast  Result Date: 10/23/2015 CLINICAL DATA:  Vomiting since last evening. Now with chest and abdominal pain. EXAM: CT ANGIOGRAPHY CHEST, ABDOMEN AND PELVIS TECHNIQUE: Multidetector CT imaging through the chest, abdomen and pelvis was performed using the standard protocol during bolus administration of intravenous contrast. Multiplanar reconstructed images and MIPs were obtained and reviewed to evaluate the vascular anatomy. CONTRAST:  100 mL of Isovue 370 COMPARISON:  None. FINDINGS: CTA CHEST FINDINGS Cardiovascular: The heart size is normal. No effusions. No coronary artery calcifications identified. The heart is normal in appearance. Mediastinum/Nodes: The thoracic aorta is normal in caliber. No dissection is identified within the thoracic aorta. The vessel arising from the thoracic aorta are normal in appearance as well. The pulmonary arteries are normal in caliber. No filling defects seen on limited views of the pulmonary arteries. The visualized esophagus is normal in appearance. No mediastinal air identified. Lungs/Pleura: Patchy bilateral infiltrates are seen most marked in the upper lobes. Minimal opacity also seen in the left lower lobe. The bilateral pulmonary opacities limit evaluation for pulmonary nodules or masses. Within this limitation, no suspicious nodule or mass is identified. No pneumothorax. The central airways are normal. Musculoskeletal: No chest wall abnormality. No acute or significant osseous findings.  Review of the MIP images confirms the above findings. CTA ABDOMEN AND PELVIS FINDINGS VASCULAR Aorta: Normal caliber aorta without aneurysm, dissection, vasculitis or significant stenosis. Celiac: Patent without evidence of aneurysm, dissection, vasculitis or significant stenosis. SMA: Patent without evidence of aneurysm, dissection, vasculitis or significant stenosis. Renals: 3 right renal arteries arise from the aorta. 2 left renal arteries arise from the aorta. IMA: Patent without evidence of aneurysm, dissection, vasculitis or significant stenosis. Inflow: Patent without evidence of aneurysm, dissection, vasculitis or significant stenosis. Veins: Poorly opacified limiting evaluation. However, within this limitation, the IVC and remainder of the veins are unremarkable. Review of the MIP images confirms the above findings. NON-VASCULAR Hepatobiliary: Evaluation is limited due to arterial phase imaging. There is a mass in the right hepatic lobe with an attenuation of 34 Hounsfield units, measuring 17 mm on series 6, image 129. No  other definitive hepatic masses identified. The gallbladder is normal in appearance. Pancreas: Unremarkable. No pancreatic ductal dilatation or surrounding inflammatory changes. Spleen: Normal in size without focal abnormality. Adrenals/Urinary Tract: Adrenal glands are unremarkable. Kidneys are normal, without renal calculi, focal lesion, or hydronephrosis. Bladder is unremarkable. Stomach/Bowel: The stomach is normal in appearance. No small bowel obstruction. The colon is normal in appearance. The appendix is well seen with no evidence of appendicitis. Lymphatic: No significant vascular findings are present. No enlarged abdominal or pelvic lymph nodes. Reproductive: Uterus and bilateral adnexa are unremarkable. Other: There is a fat containing umbilical hernia. Musculoskeletal: No acute or significant osseous findings. Review of the MIP images confirms the above findings. IMPRESSION: 1.  Bilateral patchy pulmonary opacities, particularly in the upper lobes, consistent with multi focal pneumonia versus aspiration. Recommend clinical correlation. Recommend follow-up to resolution. 2. No aortic aneurysm or dissection. 3. 1.7 cm nonspecific mass in the right hepatic lobe. This is most likely a benign etiology such as a complicated cyst or hemangioma. An MRI could further characterize to exclude neoplasm. Electronically Signed   By: Dorise Bullion III M.D   On: 10/23/2015 11:39    EKG: Independently reviewed. Vent. rate 104 BPM PR interval * ms QRS duration 112 ms QT/QTc 349/459 ms P-R-T axes 81 78 50 Sinus tachycardia LAE, consider biatrial enlargement Incomplete right bundle branch block  Assessment/Plan Principal Problem:   CAP (community acquired pneumonia) Admit to telemetry unit/inpatient. Continue supplemental oxygen. Bronchodilators Continue IV fluids. Continue azithromycin and ceftriaxone IV PB. Follow-up blood cultures and sensitivity. Check sputum Gram stain, culture and sensitivity. Check HIV status. Check strep pneumoniae urine antigen.  Active Problems:  Nausea and vomiting. Will keep nothing by mouth tonight. Continue antiemetics as needed. Start famotidine IV   GERD Famotidine 20 mg IVP every 12 hours.    Hypertension Hold amlodipine. Metoprolol 5 mg IVP q 8 hrs while NPO. Consider switching to a beta blocker to avoid or decrease anxiety induced tachycardia. Monitor blood pressure.    Anxiety Will hold amlodipine for hypertension to avoid tachycardia. Start metoprolol 5 mg IVP q 8 hrs.    Hypokalemia Currently replacing. Add on magnesium level to previous labs. Follow-up potassium level in a.m.    Abnormal EKG. Check echocardiogram during hospitalization.    Hepatic lesion. Check MRI of abdomen as recommended by radiology.    DVT prophylaxis: SCDs. Code Status: Full code. Family Communication:  Disposition Plan: Admit for IV  antibiotic therapy for several days. Consults called:  Admission status: Inpatient/telemetry.   Reubin Milan MD [facility] Pager [phone].  If 7PM-7AM, please contact night-coverage [email or web address] Password [email or web address]  10/23/2015, 11:00 PM

## 2015-10-23 NOTE — ED Provider Notes (Signed)
[facility] DEPT Provider Note   CSN: [id number] Arrival date & time: 10/23/15  1841     History   Chief Complaint Chief Complaint  Patient presents with  . Emesis    HPI Rebekah [name] is a 45 y.o. female hx of GERD, here with cough, vomiting. She has been coughing since yesterday. Also has low-grade temperature. Patient was seen in the ER early this morning and was found to have a white count 25,000. Extensive workup done including CMP, CT angio chest that shows bilateral multifocal pneumonia. Patient was prescribed levaquin and phenergan but has persistent vomiting. She states that she is unable to keep anything down. Has persistent low grade temperature at home. Denies recent admissions.   The history is provided by the patient.    Past Medical History:  Diagnosis Date  . Anxiety   . Hypertension     Patient Active Problem List   Diagnosis Date Noted  . Sinus infection 07/25/2011  . Tooth pain 07/25/2011  . ANEMIA-IRON DEFICIENCY 04/19/2009  . ALLERGIC RHINITIS 04/19/2009  . GERD 04/19/2009  . HOT FLASHES 04/19/2009  . WRIST PAIN, RIGHT, CHRONIC 04/19/2009  . KNEE PAIN, BILATERAL 04/19/2009  . BACK PAIN 04/19/2009    Past Surgical History:  Procedure Laterality Date  . WISDOM TOOTH EXTRACTION      OB History    No data available       Home Medications    Prior to Admission medications   Medication Sig Start Date End Date Taking? Authorizing Provider  amLODipine (NORVASC) 5 MG tablet Take 5 mg by mouth at bedtime.    Yes Historical Provider, MD  [employer] VITAMIN D 2000 units CAPS Take 2,000 Units by mouth daily. 09/22/15  Yes Historical Provider, MD  diazepam (VALIUM) 5 MG tablet Take 5 mg by mouth 2 (two) times daily as needed for anxiety.  10/22/15  Yes Historical Provider, MD  diclofenac (VOLTAREN) 75 MG EC tablet Take 75 mg by mouth 2 (two) times daily as needed (knee pain).    Yes Historical Provider, MD  Diclofenac Sodium 3 % GEL Apply 1 application  topically 2 (two) times daily as needed (knee pain).  07/20/15  Yes Historical Provider, MD  HYDROcodone-homatropine (HYCODAN) 5-1.5 MG/5ML syrup Take 5 mLs by mouth every 6 (six) hours as needed for cough. 10/23/15  Yes Clayton Bibles, PA-C  ibuprofen (ADVIL,MOTRIN) 200 MG tablet Take 200-400 mg by mouth every 6 (six) hours as needed for headache (pain).    Yes Historical Provider, MD  levofloxacin (LEVAQUIN) 750 MG tablet Take 1 tablet (750 mg total) by mouth daily. 10/23/15  Yes Clayton Bibles, PA-C  loratadine (CLARITIN) 10 MG tablet Take 10 mg by mouth daily as needed for allergies.  10/08/15  Yes Historical Provider, MD  promethazine (PHENERGAN) 25 MG tablet Take 1 tablet (25 mg total) by mouth every 6 (six) hours as needed for nausea. 10/23/15  Yes Clayton Bibles, PA-C  traMADol (ULTRAM) 50 MG tablet Take 100 mg by mouth every 6 (six) hours as needed (for pain).   Yes Historical Provider, MD  Ipratropium-Albuterol (COMBIVENT RESPIMAT) 20-100 MCG/ACT AERS respimat Inhale 1 puff into the lungs every 6 (six) hours as needed for wheezing or shortness of breath (or coughing). Patient not taking: Reported on 10/23/2015 [id number]   Delora Fuel, MD    Family History Family History  Problem Relation Age of Onset  . Hypertension Mother   . Hypertension Father     Social History Social History  Substance  Use Topics  . Smoking status: Current Every Day Smoker    Packs/day: 0.30    Years: 20.00  . Smokeless tobacco: Never Used     Comment: notes sometimes smokes one to two cigarettes daily  . Alcohol use Yes     Comment: wine     Allergies   Latex   Review of Systems Review of Systems  Constitutional: Positive for chills.  Gastrointestinal: Positive for vomiting.  All other systems reviewed and are negative.    Physical Exam Updated Vital Signs BP (!) 155/119   Pulse 94   Temp 99.2 F (37.3 C)   Resp 20   LMP 06/25/2011   SpO2 100%   Physical Exam  Constitutional: She is oriented to  person, place, and time.  Uncomfortable   HENT:  Head: Normocephalic.  MM slightly dry   Eyes: EOM are normal. Pupils are equal, round, and reactive to light.  Neck: Normal range of motion. Neck supple.  Cardiovascular: Normal rate, regular rhythm and normal heart sounds.   Pulmonary/Chest:  Diminished bilateral bases with crackles   Abdominal: Soft. Bowel sounds are normal. She exhibits no distension. There is no tenderness. There is no guarding.  Musculoskeletal: Normal range of motion.  Neurological: She is alert and oriented to person, place, and time.  Skin: Skin is warm.  Psychiatric: She has a normal mood and affect.  Nursing note and vitals reviewed.    ED Treatments / Results  Labs (all labs ordered are listed, but only abnormal results are displayed) Labs Reviewed  CBC WITH DIFFERENTIAL/PLATELET - Abnormal; Notable for the following:       Result Value   WBC 17.9 (*)    Neutro Abs 15.8 (*)    All other components within normal limits  BASIC METABOLIC PANEL - Abnormal; Notable for the following:    Potassium 3.0 (*)    Glucose, Bld 130 (*)    BUN 5 (*)    Calcium 8.8 (*)    All other components within normal limits  CULTURE, BLOOD (ROUTINE X 2)  CULTURE, BLOOD (ROUTINE X 2)  I-STAT CG4 LACTIC ACID, ED    EKG  EKG Interpretation None       Radiology Dg Abdomen Acute W/chest  Result Date: 10/23/2015 CLINICAL DATA:  Midsternal chest pain and epigastric pain with emesis and diarrhea since last night. EXAM: DG ABDOMEN ACUTE W/ 1V CHEST COMPARISON:  Chest radiograph 12/17/2013. FINDINGS: Trachea is midline. Heart size normal. There is a new nodular opacity in the left midlung zone. Probable asymmetric prominence of the right costochondral junction, rather than an additional nodule. Lungs are otherwise clear. No pleural fluid. Upright and supine views of the abdomen show no free air. There may be minimal gaseous prominence of the colon. No small bowel dilatation.  No unexpected radiopaque calculi. IMPRESSION: 1. Minimal gaseous prominence of the colon, nonspecific. No air-fluid levels or small bowel obstruction. 2. New nodular density in the left midlung zone. Malignancy cannot be excluded. Nonemergent CT chest without contrast is recommended in further evaluation. These results will be called to the ordering clinician or representative by the Radiologist Assistant, and communication documented in the PACS or zVision Dashboard. Electronically Signed   By: Lorin Picket M.D.   On: 10/23/2015 08:25   Ct Angio Chest/abd/pel For Dissection W And/or Wo Contrast  Result Date: 10/23/2015 CLINICAL DATA:  Vomiting since last evening. Now with chest and abdominal pain. EXAM: CT ANGIOGRAPHY CHEST, ABDOMEN AND PELVIS TECHNIQUE: Multidetector CT  imaging through the chest, abdomen and pelvis was performed using the standard protocol during bolus administration of intravenous contrast. Multiplanar reconstructed images and MIPs were obtained and reviewed to evaluate the vascular anatomy. CONTRAST:  100 mL of Isovue 370 COMPARISON:  None. FINDINGS: CTA CHEST FINDINGS Cardiovascular: The heart size is normal. No effusions. No coronary artery calcifications identified. The heart is normal in appearance. Mediastinum/Nodes: The thoracic aorta is normal in caliber. No dissection is identified within the thoracic aorta. The vessel arising from the thoracic aorta are normal in appearance as well. The pulmonary arteries are normal in caliber. No filling defects seen on limited views of the pulmonary arteries. The visualized esophagus is normal in appearance. No mediastinal air identified. Lungs/Pleura: Patchy bilateral infiltrates are seen most marked in the upper lobes. Minimal opacity also seen in the left lower lobe. The bilateral pulmonary opacities limit evaluation for pulmonary nodules or masses. Within this limitation, no suspicious nodule or mass is identified. No pneumothorax. The  central airways are normal. Musculoskeletal: No chest wall abnormality. No acute or significant osseous findings. Review of the MIP images confirms the above findings. CTA ABDOMEN AND PELVIS FINDINGS VASCULAR Aorta: Normal caliber aorta without aneurysm, dissection, vasculitis or significant stenosis. Celiac: Patent without evidence of aneurysm, dissection, vasculitis or significant stenosis. SMA: Patent without evidence of aneurysm, dissection, vasculitis or significant stenosis. Renals: 3 right renal arteries arise from the aorta. 2 left renal arteries arise from the aorta. IMA: Patent without evidence of aneurysm, dissection, vasculitis or significant stenosis. Inflow: Patent without evidence of aneurysm, dissection, vasculitis or significant stenosis. Veins: Poorly opacified limiting evaluation. However, within this limitation, the IVC and remainder of the veins are unremarkable. Review of the MIP images confirms the above findings. NON-VASCULAR Hepatobiliary: Evaluation is limited due to arterial phase imaging. There is a mass in the right hepatic lobe with an attenuation of 34 Hounsfield units, measuring 17 mm on series 6, image 129. No other definitive hepatic masses identified. The gallbladder is normal in appearance. Pancreas: Unremarkable. No pancreatic ductal dilatation or surrounding inflammatory changes. Spleen: Normal in size without focal abnormality. Adrenals/Urinary Tract: Adrenal glands are unremarkable. Kidneys are normal, without renal calculi, focal lesion, or hydronephrosis. Bladder is unremarkable. Stomach/Bowel: The stomach is normal in appearance. No small bowel obstruction. The colon is normal in appearance. The appendix is well seen with no evidence of appendicitis. Lymphatic: No significant vascular findings are present. No enlarged abdominal or pelvic lymph nodes. Reproductive: Uterus and bilateral adnexa are unremarkable. Other: There is a fat containing umbilical hernia.  Musculoskeletal: No acute or significant osseous findings. Review of the MIP images confirms the above findings. IMPRESSION: 1. Bilateral patchy pulmonary opacities, particularly in the upper lobes, consistent with multi focal pneumonia versus aspiration. Recommend clinical correlation. Recommend follow-up to resolution. 2. No aortic aneurysm or dissection. 3. 1.7 cm nonspecific mass in the right hepatic lobe. This is most likely a benign etiology such as a complicated cyst or hemangioma. An MRI could further characterize to exclude neoplasm. Electronically Signed   By: Dorise Bullion III M.D   On: 10/23/2015 11:39    Procedures Procedures (including critical care time)  Medications Ordered in ED Medications  morphine 4 MG/ML injection 4 mg (4 mg Intravenous Not Given 10/23/15 2139)  azithromycin (ZITHROMAX) 500 mg in dextrose 5 % 250 mL IVPB (500 mg Intravenous New Bag/Given 10/23/15 2240)  potassium chloride 10 mEq in 100 mL IVPB (not administered)  metoCLOPramide (REGLAN) injection 10 mg (not administered)  diphenhydrAMINE (BENADRYL) injection 50 mg (not administered)  HYDROmorphone (DILAUDID) injection 1 mg (not administered)  ondansetron (ZOFRAN-ODT) disintegrating tablet 8 mg (8 mg Oral Given 10/23/15 1945)  sodium chloride 0.9 % bolus 1,000 mL (1,000 mLs Intravenous New Bag/Given 10/23/15 2158)  promethazine (PHENERGAN) injection 12.5 mg (12.5 mg Intravenous Given 10/23/15 2137)  cefTRIAXone (ROCEPHIN) 1 g in dextrose 5 % 50 mL IVPB (1 g Intravenous New Bag/Given 10/23/15 2142)  HYDROmorphone (DILAUDID) injection 1 mg (1 mg Intravenous Given 10/23/15 2156)     Initial Impression / Assessment and Plan / ED Course  I have reviewed the triage vital signs and the nursing notes.  Pertinent labs & imaging results that were available during my care of the patient were reviewed by me and considered in my medical decision making (see chart for details).  Clinical Course   Oshun Perrilloux  is a 45 y.o. female here with persistent cough, vomiting, unable to keep down abx. Reviewed chart from earlier in the day. Has WBC 25, multifocal pneumonia on CT. Will do sepsis workup and likely admit. Will give ceftriaxone/azithro.   10:51 PM WBC 17. K 3.0, given KCl IV since she can't tolerate PO. Given phenergan and zofran but still vomiting. Ordered reglan, benadryl. hospitalist to admit.    Final Clinical Impressions(s) / ED Diagnoses   Final diagnoses:  None    New Prescriptions New Prescriptions   No medications on file     Drenda Freeze, MD 10/23/15 2252

## 2015-10-23 NOTE — ED Notes (Signed)
Pt. Vomited , reported to [facility] [place], [place]

## 2015-10-23 NOTE — ED Triage Notes (Signed)
Pt discharged from ED earlier today, dx: pneumonia, prescribed antibiotic, phenergan and pain med.  Pt has not been able to keep down any meds.  Pt c/o back, abd, chest pain.

## 2015-10-23 NOTE — ED Provider Notes (Signed)
[facility] DEPT Provider Note   CSN: [id number] Arrival date & time: 10/23/15  0547     History   Chief Complaint Chief Complaint  Patient presents with  . Chest Pain  . Abdominal Pain    HPI Rebekah [name] is a 45 y.o. female.  HPI   Patient with hx anxiety, hypertension p/w chest and abdominal pain following profuse vomiting and diarrhea that began yesterday.  Notes she is now very tired and generally weak.  She initially had an episode of anxiety and syncope following news about her friend's death.  The other symptoms followed this. The chest pain is central and described as sore, worse with palpation and breathing.  Abdominal pain is diffuse and crampy.  Denies fevers, SOB, urinary or vaginal symptoms.  Denies significant injury from the fall with her syncopal episode.   Denies known sick contacts or abnormal foods.   Past Medical History:  Diagnosis Date  . Anxiety   . Hypertension     Patient Active Problem List   Diagnosis Date Noted  . Sinus infection 07/25/2011  . Tooth pain 07/25/2011  . ANEMIA-IRON DEFICIENCY 04/19/2009  . ALLERGIC RHINITIS 04/19/2009  . GERD 04/19/2009  . HOT FLASHES 04/19/2009  . WRIST PAIN, RIGHT, CHRONIC 04/19/2009  . KNEE PAIN, BILATERAL 04/19/2009  . BACK PAIN 04/19/2009    Past Surgical History:  Procedure Laterality Date  . WISDOM TOOTH EXTRACTION      OB History    No data available       Home Medications    Prior to Admission medications   Medication Sig Start Date End Date Taking? Authorizing Provider  amLODipine (NORVASC) 5 MG tablet Take 5 mg by mouth daily.   Yes Historical Provider, MD  diclofenac (VOLTAREN) 75 MG EC tablet Take 75 mg by mouth 2 (two) times daily as needed (for knee pain).    Yes Historical Provider, MD  traMADol (ULTRAM) 50 MG tablet Take 100 mg by mouth every 6 (six) hours as needed (for pain).   Yes Historical Provider, MD  HYDROcodone-homatropine (HYCODAN) 5-1.5 MG/5ML syrup Take 5 mLs  by mouth every 6 (six) hours as needed for cough. 10/23/15   Clayton Bibles, PA-C  ibuprofen (ADVIL,MOTRIN) 200 MG tablet Take 200-400 mg by mouth every 6 (six) hours as needed for headache (or pain). For pain    Historical Provider, MD  Ipratropium-Albuterol (COMBIVENT RESPIMAT) 20-100 MCG/ACT AERS respimat Inhale 1 puff into the lungs every 6 (six) hours as needed for wheezing or shortness of breath (or coughing). Patient not taking: Reported on 10/23/2015 99991111   Delora Fuel, MD  levofloxacin (LEVAQUIN) 750 MG tablet Take 1 tablet (750 mg total) by mouth daily. 10/23/15   Clayton Bibles, PA-C  lisinopril (PRINIVIL,ZESTRIL) 10 MG tablet Take 10 mg by mouth daily.    Historical Provider, MD  predniSONE (DELTASONE) 50 MG tablet Take 1 tablet (50 mg total) by mouth daily. Patient not taking: Reported on 10/23/2015 99991111   Delora Fuel, MD  promethazine (PHENERGAN) 25 MG tablet Take 1 tablet (25 mg total) by mouth every 6 (six) hours as needed for nausea. 10/23/15   Clayton Bibles, PA-C    Family History Family History  Problem Relation Age of Onset  . Hypertension Mother   . Hypertension Father     Social History Social History  Substance Use Topics  . Smoking status: Current Every Day Smoker    Packs/day: 0.30    Years: 20.00  . Smokeless tobacco: Never Used  Comment: notes sometimes smokes one to two cigarettes daily  . Alcohol use Yes     Comment: wine     Allergies   Latex   Review of Systems Review of Systems  All other systems reviewed and are negative.    Physical Exam Updated Vital Signs BP 127/98   Pulse 103   Temp 98.2 F (36.8 C) (Oral)   Resp 24   Wt 74.8 kg   LMP 06/25/2011   SpO2 100%   BMI 25.09 kg/m   Physical Exam  Constitutional: She appears well-developed and well-nourished. No distress.  HENT:  Head: Normocephalic and atraumatic.  Neck: Neck supple.  Cardiovascular: Normal rate and regular rhythm.   Pulmonary/Chest: Effort normal and breath  sounds normal. No respiratory distress. She has no wheezes. She has no rales. She exhibits tenderness.  Abdominal: Soft. She exhibits no distension. There is tenderness (diffuse). There is no rebound and no guarding.  Musculoskeletal: She exhibits no edema.  Neurological: She is alert.  Skin: She is not diaphoretic.  Nursing note and vitals reviewed.    ED Treatments / Results  Labs (all labs ordered are listed, but only abnormal results are displayed) Labs Reviewed  CBC WITH DIFFERENTIAL/PLATELET - Abnormal; Notable for the following:       Result Value   WBC 25.1 (*)    MCV 77.8 (*)    Neutro Abs 23.3 (*)    All other components within normal limits  COMPREHENSIVE METABOLIC PANEL - Abnormal; Notable for the following:    Potassium 3.4 (*)    Glucose, Bld 214 (*)    BUN 5 (*)    All other components within normal limits  URINALYSIS, ROUTINE W REFLEX MICROSCOPIC (NOT AT [facility]) - Abnormal; Notable for the following:    Glucose, UA 100 (*)    Hgb urine dipstick SMALL (*)    Ketones, ur 15 (*)    All other components within normal limits  URINE MICROSCOPIC-ADD ON - Abnormal; Notable for the following:    Squamous Epithelial / LPF 6-30 (*)    All other components within normal limits  LIPASE, BLOOD  I-STAT TROPOININ, ED  I-STAT BETA HCG BLOOD, ED ([facility], [facility], [facility] ONLY)    EKG  EKG Interpretation None       Radiology Dg Abdomen Acute W/chest  Result Date: 10/23/2015 CLINICAL DATA:  Midsternal chest pain and epigastric pain with emesis and diarrhea since last night. EXAM: DG ABDOMEN ACUTE W/ 1V CHEST COMPARISON:  Chest radiograph 12/17/2013. FINDINGS: Trachea is midline. Heart size normal. There is a new nodular opacity in the left midlung zone. Probable asymmetric prominence of the right costochondral junction, rather than an additional nodule. Lungs are otherwise clear. No pleural fluid. Upright and supine views of the abdomen show no free air. There may be minimal gaseous  prominence of the colon. No small bowel dilatation. No unexpected radiopaque calculi. IMPRESSION: 1. Minimal gaseous prominence of the colon, nonspecific. No air-fluid levels or small bowel obstruction. 2. New nodular density in the left midlung zone. Malignancy cannot be excluded. Nonemergent CT chest without contrast is recommended in further evaluation. These results will be called to the ordering clinician or representative by the Radiologist Assistant, and communication documented in the PACS or zVision Dashboard. Electronically Signed   By: Lorin Picket M.D.   On: 10/23/2015 08:25   Ct Angio Chest/abd/pel For Dissection W And/or Wo Contrast  Result Date: 10/23/2015 CLINICAL DATA:  Vomiting since last evening. Now with chest  and abdominal pain. EXAM: CT ANGIOGRAPHY CHEST, ABDOMEN AND PELVIS TECHNIQUE: Multidetector CT imaging through the chest, abdomen and pelvis was performed using the standard protocol during bolus administration of intravenous contrast. Multiplanar reconstructed images and MIPs were obtained and reviewed to evaluate the vascular anatomy. CONTRAST:  100 mL of Isovue 370 COMPARISON:  None. FINDINGS: CTA CHEST FINDINGS Cardiovascular: The heart size is normal. No effusions. No coronary artery calcifications identified. The heart is normal in appearance. Mediastinum/Nodes: The thoracic aorta is normal in caliber. No dissection is identified within the thoracic aorta. The vessel arising from the thoracic aorta are normal in appearance as well. The pulmonary arteries are normal in caliber. No filling defects seen on limited views of the pulmonary arteries. The visualized esophagus is normal in appearance. No mediastinal air identified. Lungs/Pleura: Patchy bilateral infiltrates are seen most marked in the upper lobes. Minimal opacity also seen in the left lower lobe. The bilateral pulmonary opacities limit evaluation for pulmonary nodules or masses. Within this limitation, no suspicious  nodule or mass is identified. No pneumothorax. The central airways are normal. Musculoskeletal: No chest wall abnormality. No acute or significant osseous findings. Review of the MIP images confirms the above findings. CTA ABDOMEN AND PELVIS FINDINGS VASCULAR Aorta: Normal caliber aorta without aneurysm, dissection, vasculitis or significant stenosis. Celiac: Patent without evidence of aneurysm, dissection, vasculitis or significant stenosis. SMA: Patent without evidence of aneurysm, dissection, vasculitis or significant stenosis. Renals: 3 right renal arteries arise from the aorta. 2 left renal arteries arise from the aorta. IMA: Patent without evidence of aneurysm, dissection, vasculitis or significant stenosis. Inflow: Patent without evidence of aneurysm, dissection, vasculitis or significant stenosis. Veins: Poorly opacified limiting evaluation. However, within this limitation, the IVC and remainder of the veins are unremarkable. Review of the MIP images confirms the above findings. NON-VASCULAR Hepatobiliary: Evaluation is limited due to arterial phase imaging. There is a mass in the right hepatic lobe with an attenuation of 34 Hounsfield units, measuring 17 mm on series 6, image 129. No other definitive hepatic masses identified. The gallbladder is normal in appearance. Pancreas: Unremarkable. No pancreatic ductal dilatation or surrounding inflammatory changes. Spleen: Normal in size without focal abnormality. Adrenals/Urinary Tract: Adrenal glands are unremarkable. Kidneys are normal, without renal calculi, focal lesion, or hydronephrosis. Bladder is unremarkable. Stomach/Bowel: The stomach is normal in appearance. No small bowel obstruction. The colon is normal in appearance. The appendix is well seen with no evidence of appendicitis. Lymphatic: No significant vascular findings are present. No enlarged abdominal or pelvic lymph nodes. Reproductive: Uterus and bilateral adnexa are unremarkable. Other: There  is a fat containing umbilical hernia. Musculoskeletal: No acute or significant osseous findings. Review of the MIP images confirms the above findings. IMPRESSION: 1. Bilateral patchy pulmonary opacities, particularly in the upper lobes, consistent with multi focal pneumonia versus aspiration. Recommend clinical correlation. Recommend follow-up to resolution. 2. No aortic aneurysm or dissection. 3. 1.7 cm nonspecific mass in the right hepatic lobe. This is most likely a benign etiology such as a complicated cyst or hemangioma. An MRI could further characterize to exclude neoplasm. Electronically Signed   By: Dorise Bullion III M.D   On: 10/23/2015 11:39    Procedures Procedures (including critical care time)  Medications Ordered in ED Medications  sodium chloride 0.9 % bolus 1,000 mL (0 mLs Intravenous Stopped 10/23/15 0805)  morphine 4 MG/ML injection 4 mg (4 mg Intravenous Given 10/23/15 0655)  ondansetron (ZOFRAN) injection 4 mg (4 mg Intravenous Given 10/23/15 0656)  HYDROmorphone (DILAUDID) injection 1 mg (1 mg Intravenous Given 10/23/15 0805)  metoCLOPramide (REGLAN) injection 10 mg (10 mg Intravenous Given 10/23/15 0801)  iopamidol (ISOVUE-370) 76 % injection (100 mLs  Contrast Given 10/23/15 1057)  HYDROmorphone (DILAUDID) injection 1 mg (1 mg Intravenous Given 10/23/15 1008)  ondansetron (ZOFRAN) injection 4 mg (4 mg Intravenous Given 10/23/15 1005)  sodium chloride 0.9 % bolus 1,000 mL (0 mLs Intravenous Stopped 10/23/15 1204)     Initial Impression / Assessment and Plan / ED Course  I have reviewed the triage vital signs and the nursing notes.  Pertinent labs & imaging results that were available during my care of the patient were reviewed by me and considered in my medical decision making (see chart for details).  Clinical Course  Comment By Time  Patient reports feeling much better, pain relieved.   Clayton Bibles, PA-C 10/14 1205    Afebrile, nontoxic patient with cough that  escalated through the day yesterday followed by vomiting (30 episodes) and diarrhea (10 episodes), chest and abdominal pain.  Symptoms well controlled in the ED.  Extensive workup remarkable for leukocytosis (25), multifocal pneumonia vs aspiration pneumonia.  Discussed patient, workup, results, and plan with Dr Lita Mains.  Pt is not hypoxic, is pain free after treatment with pain and nausea medication, IVF, happy and interactive, ready to go home.  D/C home with levaquin, cough medication, phenergan, close PCP follow up.  Discussed result, findings, treatment, and follow up  with patient.  Pt given return precautions.  Pt verbalizes understanding and agrees with plan.       Final Clinical Impressions(s) / ED Diagnoses   Final diagnoses:  Community acquired pneumonia, unspecified laterality  Vomiting and diarrhea    New Prescriptions Discharge Medication List as of 10/23/2015 12:09 PM    START taking these medications   Details  HYDROcodone-homatropine (HYCODAN) 5-1.5 MG/5ML syrup Take 5 mLs by mouth every 6 (six) hours as needed for cough., Starting Sat 10/23/2015, Print    levofloxacin (LEVAQUIN) 750 MG tablet Take 1 tablet (750 mg total) by mouth daily., Starting Sat 10/23/2015, Print    promethazine (PHENERGAN) 25 MG tablet Take 1 tablet (25 mg total) by mouth every 6 (six) hours as needed for nausea., Starting Sat 10/23/2015, Print         [place], PA-C 10/23/15 [place], MD 10/24/15 [phone]

## 2015-10-23 NOTE — ED Notes (Signed)
Unable to obtain second set of cultures prior to antibiotics

## 2015-10-24 ENCOUNTER — Inpatient Hospital Stay (HOSPITAL_COMMUNITY): Payer: 59

## 2015-10-24 ENCOUNTER — Encounter (HOSPITAL_COMMUNITY): Payer: Self-pay | Admitting: Internal Medicine

## 2015-10-24 DIAGNOSIS — E876 Hypokalemia: Secondary | ICD-10-CM

## 2015-10-24 DIAGNOSIS — G43A1 Cyclical vomiting, intractable: Secondary | ICD-10-CM

## 2015-10-24 DIAGNOSIS — K219 Gastro-esophageal reflux disease without esophagitis: Secondary | ICD-10-CM

## 2015-10-24 DIAGNOSIS — J181 Lobar pneumonia, unspecified organism: Secondary | ICD-10-CM

## 2015-10-24 DIAGNOSIS — K769 Liver disease, unspecified: Secondary | ICD-10-CM

## 2015-10-24 LAB — COMPREHENSIVE METABOLIC PANEL
ALT: 18 U/L (ref 14–54)
ANION GAP: 8 (ref 5–15)
AST: 22 U/L (ref 15–41)
Albumin: 3.2 g/dL — ABNORMAL LOW (ref 3.5–5.0)
Alkaline Phosphatase: 64 U/L (ref 38–126)
BILIRUBIN TOTAL: 0.4 mg/dL (ref 0.3–1.2)
BUN: <5 mg/dL — ABNORMAL LOW (ref 6–20)
CALCIUM: 8.1 mg/dL — AB (ref 8.9–10.3)
CO2: 23 mmol/L (ref 22–32)
Chloride: 110 mmol/L (ref 101–111)
Creatinine, Ser: 0.8 mg/dL (ref 0.44–1.00)
Glucose, Bld: 132 mg/dL — ABNORMAL HIGH (ref 65–99)
POTASSIUM: 3.5 mmol/L (ref 3.5–5.1)
Sodium: 141 mmol/L (ref 135–145)
TOTAL PROTEIN: 6.4 g/dL — AB (ref 6.5–8.1)

## 2015-10-24 LAB — CBC WITH DIFFERENTIAL/PLATELET
BASOS ABS: 0 10*3/uL (ref 0.0–0.1)
BASOS PCT: 0 %
Eosinophils Absolute: 0 10*3/uL (ref 0.0–0.7)
Eosinophils Relative: 0 %
HEMATOCRIT: 33.1 % — AB (ref 36.0–46.0)
Hemoglobin: 10.9 g/dL — ABNORMAL LOW (ref 12.0–15.0)
LYMPHS PCT: 16 %
Lymphs Abs: 2.2 10*3/uL (ref 0.7–4.0)
MCH: 26.2 pg (ref 26.0–34.0)
MCHC: 32.9 g/dL (ref 30.0–36.0)
MCV: 79.6 fL (ref 78.0–100.0)
Monocytes Absolute: 1.1 10*3/uL — ABNORMAL HIGH (ref 0.1–1.0)
Monocytes Relative: 8 %
NEUTROS ABS: 10.6 10*3/uL — AB (ref 1.7–7.7)
Neutrophils Relative %: 76 %
PLATELETS: 213 10*3/uL (ref 150–400)
RBC: 4.16 MIL/uL (ref 3.87–5.11)
RDW: 14.5 % (ref 11.5–15.5)
WBC: 13.8 10*3/uL — AB (ref 4.0–10.5)

## 2015-10-24 LAB — STREP PNEUMONIAE URINARY ANTIGEN: Strep Pneumo Urinary Antigen: NEGATIVE

## 2015-10-24 MED ORDER — DEXTROSE 5 % IV SOLN
1.0000 g | INTRAVENOUS | Status: DC
  Administered 2015-10-24: 1 g via INTRAVENOUS
  Filled 2015-10-24 (×2): qty 10

## 2015-10-24 MED ORDER — MAGNESIUM SULFATE 2 GM/50ML IV SOLN
2.0000 g | Freq: Once | INTRAVENOUS | Status: AC
  Administered 2015-10-24: 2 g via INTRAVENOUS
  Filled 2015-10-24: qty 50

## 2015-10-24 MED ORDER — ONDANSETRON HCL 4 MG/2ML IJ SOLN
4.0000 mg | Freq: Four times a day (QID) | INTRAMUSCULAR | Status: DC | PRN
  Administered 2015-10-25: 4 mg via INTRAVENOUS
  Filled 2015-10-24: qty 2

## 2015-10-24 MED ORDER — SODIUM CHLORIDE 0.9% FLUSH
3.0000 mL | Freq: Two times a day (BID) | INTRAVENOUS | Status: DC
  Administered 2015-10-24 – 2015-10-25 (×3): 3 mL via INTRAVENOUS

## 2015-10-24 MED ORDER — DEXTROSE 5 % IV SOLN
500.0000 mg | INTRAVENOUS | Status: DC
  Administered 2015-10-25: 500 mg via INTRAVENOUS
  Filled 2015-10-24 (×2): qty 500

## 2015-10-24 MED ORDER — GADOXETATE DISODIUM 0.25 MMOL/ML IV SOLN: 8.0000 mL | Freq: Once | INTRAVENOUS | Status: DC | PRN

## 2015-10-24 MED ORDER — IPRATROPIUM-ALBUTEROL 0.5-2.5 (3) MG/3ML IN SOLN: 3.0000 mL | Freq: Four times a day (QID) | RESPIRATORY_TRACT | Status: DC | PRN

## 2015-10-24 MED ORDER — ONDANSETRON HCL 4 MG PO TABS: 4.0000 mg | ORAL_TABLET | Freq: Four times a day (QID) | ORAL | Status: DC | PRN

## 2015-10-24 MED ORDER — METOPROLOL TARTRATE 5 MG/5ML IV SOLN
5.0000 mg | Freq: Three times a day (TID) | INTRAVENOUS | Status: DC
  Administered 2015-10-24 – 2015-10-25 (×5): 5 mg via INTRAVENOUS
  Filled 2015-10-24 (×5): qty 5

## 2015-10-24 MED ORDER — METOCLOPRAMIDE HCL 5 MG/ML IJ SOLN
5.0000 mg | Freq: Four times a day (QID) | INTRAMUSCULAR | Status: DC | PRN
  Administered 2015-10-24 – 2015-10-25 (×6): 5 mg via INTRAVENOUS
  Filled 2015-10-24 (×6): qty 2

## 2015-10-24 MED ORDER — HYDROMORPHONE HCL 1 MG/ML IJ SOLN
1.0000 mg | INTRAMUSCULAR | Status: DC | PRN
  Administered 2015-10-24 – 2015-10-25 (×9): 1 mg via INTRAVENOUS
  Filled 2015-10-24 (×9): qty 1

## 2015-10-24 MED ORDER — POTASSIUM CHLORIDE IN NACL 20-0.9 MEQ/L-% IV SOLN
INTRAVENOUS | Status: DC
  Administered 2015-10-24 – 2015-10-25 (×4): via INTRAVENOUS
  Filled 2015-10-24 (×5): qty 1000

## 2015-10-24 NOTE — Progress Notes (Signed)
PROGRESS NOTE                                                                                                                                                                                                             Patient Demographics:    Rebekah [name], is a 45 y.o. female, DOB - 1970-02-11, [id number]  Admit date - 10/23/2015   Admitting Physician Reubin Milan, MD  Outpatient Primary MD for the patient is Annye Asa, MD  LOS - 1  Outpatient Specialists:None  Chief Complaint  Patient presents with  . Emesis       Brief Narrative   45 year old female with history of anxiety, GERD and hypertension return to the ED with multiple episodes of nausea and vomiting with increased anxiety and an episode of syncope. Patient also reported palpitations. Reports subjective fever. She presented to the ED earlier with several episodes of nausea and vomiting, was managed symptomatically and discharged home. In the ED she was mildly tachycardic, afebrile. Blood work showed leukocytosis with WBC of 20 5K and hypokalemia. CT angiogram of the chest done was negative for PE but showed patchy bilateral infiltrate suggestive of multilobar pneumonia. Admitted to hospitalist service.   Subjective:   No further vomiting since admission. Would like to try some food.   Assessment  & Plan :    Principal Problem: Multilobar pneumonia Empiric Rocephin and azithromycin. Supportive care with IV fluids might Tylenol. Follow cultures. Leukocytosis improved.   Active Problems: Intractable nausea and vomiting Suspect this is due to pneumonia. Symptoms better now. Start clears and advance as tolerated. When necessary antiemetics.    GERD Continue Pepcid.  Essential hypertension Placed on scheduled Lopressor. Resume home medications once tolerating by mouth    Anxiety Xanax when necessary     Hypokalemia Replenish   Hepatic lesion Right hepatic lobe lesion measuring 1.7 cm.? Hemangioma Incidental finding on CT angiogram chest. MRI abdomen ordered.      Code Status : Full code.   Family Communication  : Husband and daughter at bedside  Disposition Plan  : Home tomorrow if improved  Barriers For Discharge : Active symptoms  Consults  : None  Procedures  :  CT angiogram of the chest  DVT Prophylaxis  :  Lovenox -  Lab Results  Component Value Date   PLT 213 10/24/2015  Antibiotics  :  Anti-infectives    Start     Dose/Rate Route Frequency Ordered Stop   10/24/15 2200  cefTRIAXone (ROCEPHIN) 1 g in dextrose 5 % 50 mL IVPB     1 g 100 mL/hr over 30 Minutes Intravenous Every 24 hours 10/24/15 0138 10/31/15 2159   10/24/15 2200  azithromycin (ZITHROMAX) 500 mg in dextrose 5 % 250 mL IVPB     500 mg 250 mL/hr over 60 Minutes Intravenous Every 24 hours 10/24/15 0138 10/31/15 2159   10/23/15 2130  cefTRIAXone (ROCEPHIN) 1 g in dextrose 5 % 50 mL IVPB     1 g 100 mL/hr over 30 Minutes Intravenous  Once 10/23/15 2123 10/23/15 2300   10/23/15 2130  azithromycin (ZITHROMAX) 500 mg in dextrose 5 % 250 mL IVPB     500 mg 250 mL/hr over 60 Minutes Intravenous  Once 10/23/15 2123 10/23/15 2340        Objective:   Vitals:   10/23/15 2345 10/24/15 0000 10/24/15 0123 10/24/15 0456  BP: (!) 146/107 153/98 107/84 118/79  Pulse:  99 61 95  Resp:   19 19  Temp:   98.2 F (36.8 C) 98.4 F (36.9 C)  TempSrc:   Oral   SpO2:  99% 98% 97%  Weight:   74.4 kg (164 lb)   Height:   5\' 9"  (1.753 m)     Wt Readings from Last 3 Encounters:  10/24/15 74.4 kg (164 lb)  10/23/15 74.8 kg (165 lb)  12/17/13 74.8 kg (165 lb)     Intake/Output Summary (Last 24 hours) at 10/24/15 1255 Last data filed at 10/24/15 0612  Gross per 24 hour  Intake           1490.5 ml  Output                0 ml  Net           1490.5 ml     Physical Exam  Gen: not in distress HEENT:  Dry mucosa, supple neck Chest: Diminished bilateral breath sounds, no added sounds CVS: N S1&S2, no murmurs, rubs or gallop GI: soft, NT, ND, BS+ Musculoskeletal: warm, no edema     Data Review:    CBC  Recent Labs Lab 10/23/15 0604 10/23/15 2112 10/24/15 0529  WBC 25.1* 17.9* 13.8*  HGB 13.0 12.3 10.9*  HCT 38.5 37.1 33.1*  PLT 274 252 213  MCV 77.8* 79.1 79.6  MCH 26.3 26.2 26.2  MCHC 33.8 33.2 32.9  RDW 14.3 14.3 14.5  LYMPHSABS 1.0 1.5 2.2  MONOABS 0.8 0.6 1.1*  EOSABS 0.0 0.0 0.0  BASOSABS 0.0 0.0 0.0    Chemistries   Recent Labs Lab 10/23/15 0604 10/23/15 2112 10/24/15 0529  NA 138 138 141  K 3.4* 3.0* 3.5  CL 104 105 110  CO2 24 22 23   GLUCOSE 214* 130* 132*  BUN 5* 5* <5*  CREATININE 0.83 0.85 0.80  CALCIUM 9.4 8.8* 8.1*  MG 1.9  --   --   AST 23  --  22  ALT 23  --  18  ALKPHOS 88  --  64  BILITOT 0.9  --  0.4   ------------------------------------------------------------------------------------------------------------------ No results for input(s): CHOL, HDL, LDLCALC, TRIG, CHOLHDL, LDLDIRECT in the last 72 hours.  No results found for: HGBA1C ------------------------------------------------------------------------------------------------------------------ No results for input(s): TSH, T4TOTAL, T3FREE, THYROIDAB in the last 72 hours.  Invalid input(s): FREET3 ------------------------------------------------------------------------------------------------------------------ No results for input(s): VITAMINB12, FOLATE, FERRITIN, TIBC,  IRON, RETICCTPCT in the last 72 hours.  Coagulation profile No results for input(s): INR, PROTIME in the last 168 hours.  No results for input(s): DDIMER in the last 72 hours.  Cardiac Enzymes No results for input(s): CKMB, TROPONINI, MYOGLOBIN in the last 168 hours.  Invalid input(s): CK ------------------------------------------------------------------------------------------------------------------ No  results found for: BNP  Inpatient Medications  Scheduled Meds: . azithromycin  500 mg Intravenous Q24H  . cefTRIAXone (ROCEPHIN)  IV  1 g Intravenous Q24H  . famotidine (PEPCID) IV  20 mg Intravenous Q12H  . metoprolol  5 mg Intravenous Q8H  . sodium chloride flush  3 mL Intravenous Q12H   Continuous Infusions: . 0.9 % NaCl with KCl 20 mEq / L 125 mL/hr at 10/24/15 0242   PRN Meds:.gadoxetate disodium, HYDROmorphone (DILAUDID) injection, ipratropium-albuterol, metoCLOPramide (REGLAN) injection, ondansetron **OR** ondansetron (ZOFRAN) IV  Micro Results No results found for this or any previous visit (from the past 240 hour(s)).  Radiology Reports Dg Abdomen Acute W/chest  Result Date: 10/23/2015 CLINICAL DATA:  Midsternal chest pain and epigastric pain with emesis and diarrhea since last night. EXAM: DG ABDOMEN ACUTE W/ 1V CHEST COMPARISON:  Chest radiograph 12/17/2013. FINDINGS: Trachea is midline. Heart size normal. There is a new nodular opacity in the left midlung zone. Probable asymmetric prominence of the right costochondral junction, rather than an additional nodule. Lungs are otherwise clear. No pleural fluid. Upright and supine views of the abdomen show no free air. There may be minimal gaseous prominence of the colon. No small bowel dilatation. No unexpected radiopaque calculi. IMPRESSION: 1. Minimal gaseous prominence of the colon, nonspecific. No air-fluid levels or small bowel obstruction. 2. New nodular density in the left midlung zone. Malignancy cannot be excluded. Nonemergent CT chest without contrast is recommended in further evaluation. These results will be called to the ordering clinician or representative by the Radiologist Assistant, and communication documented in the PACS or zVision Dashboard. Electronically Signed   By: Lorin Picket M.D.   On: 10/23/2015 08:25   Ct Angio Chest/abd/pel For Dissection W And/or Wo Contrast  Result Date: 10/23/2015 CLINICAL DATA:   Vomiting since last evening. Now with chest and abdominal pain. EXAM: CT ANGIOGRAPHY CHEST, ABDOMEN AND PELVIS TECHNIQUE: Multidetector CT imaging through the chest, abdomen and pelvis was performed using the standard protocol during bolus administration of intravenous contrast. Multiplanar reconstructed images and MIPs were obtained and reviewed to evaluate the vascular anatomy. CONTRAST:  100 mL of Isovue 370 COMPARISON:  None. FINDINGS: CTA CHEST FINDINGS Cardiovascular: The heart size is normal. No effusions. No coronary artery calcifications identified. The heart is normal in appearance. Mediastinum/Nodes: The thoracic aorta is normal in caliber. No dissection is identified within the thoracic aorta. The vessel arising from the thoracic aorta are normal in appearance as well. The pulmonary arteries are normal in caliber. No filling defects seen on limited views of the pulmonary arteries. The visualized esophagus is normal in appearance. No mediastinal air identified. Lungs/Pleura: Patchy bilateral infiltrates are seen most marked in the upper lobes. Minimal opacity also seen in the left lower lobe. The bilateral pulmonary opacities limit evaluation for pulmonary nodules or masses. Within this limitation, no suspicious nodule or mass is identified. No pneumothorax. The central airways are normal. Musculoskeletal: No chest wall abnormality. No acute or significant osseous findings. Review of the MIP images confirms the above findings. CTA ABDOMEN AND PELVIS FINDINGS VASCULAR Aorta: Normal caliber aorta without aneurysm, dissection, vasculitis or significant stenosis. Celiac: Patent without evidence of aneurysm,  dissection, vasculitis or significant stenosis. SMA: Patent without evidence of aneurysm, dissection, vasculitis or significant stenosis. Renals: 3 right renal arteries arise from the aorta. 2 left renal arteries arise from the aorta. IMA: Patent without evidence of aneurysm, dissection, vasculitis or  significant stenosis. Inflow: Patent without evidence of aneurysm, dissection, vasculitis or significant stenosis. Veins: Poorly opacified limiting evaluation. However, within this limitation, the IVC and remainder of the veins are unremarkable. Review of the MIP images confirms the above findings. NON-VASCULAR Hepatobiliary: Evaluation is limited due to arterial phase imaging. There is a mass in the right hepatic lobe with an attenuation of 34 Hounsfield units, measuring 17 mm on series 6, image 129. No other definitive hepatic masses identified. The gallbladder is normal in appearance. Pancreas: Unremarkable. No pancreatic ductal dilatation or surrounding inflammatory changes. Spleen: Normal in size without focal abnormality. Adrenals/Urinary Tract: Adrenal glands are unremarkable. Kidneys are normal, without renal calculi, focal lesion, or hydronephrosis. Bladder is unremarkable. Stomach/Bowel: The stomach is normal in appearance. No small bowel obstruction. The colon is normal in appearance. The appendix is well seen with no evidence of appendicitis. Lymphatic: No significant vascular findings are present. No enlarged abdominal or pelvic lymph nodes. Reproductive: Uterus and bilateral adnexa are unremarkable. Other: There is a fat containing umbilical hernia. Musculoskeletal: No acute or significant osseous findings. Review of the MIP images confirms the above findings. IMPRESSION: 1. Bilateral patchy pulmonary opacities, particularly in the upper lobes, consistent with multi focal pneumonia versus aspiration. Recommend clinical correlation. Recommend follow-up to resolution. 2. No aortic aneurysm or dissection. 3. 1.7 cm nonspecific mass in the right hepatic lobe. This is most likely a benign etiology such as a complicated cyst or hemangioma. An MRI could further characterize to exclude neoplasm. Electronically Signed   By: Dorise Bullion III M.D   On: 10/23/2015 11:39    Time Spent in minutes   25   Louellen Molder M.D on 10/24/2015 at 12:55 PM  Between 7am to 7pm - Pager - [phone]  After 7pm go to [email or web address] - password [facility]  [facility] -  Office  [phone]

## 2015-10-24 NOTE — Progress Notes (Signed)
NURSING PROGRESS NOTE  Rebekah StatonMRN: [id number] Admission Data: 10/24/2015 at [place] Attending Provider: Reubin Milan, MD PCP: Annye Asa, MD Code status: Full  Allergies:  Allergies  Allergen Reactions  . Latex Rash   Past Medical History:  Past Medical History:  Diagnosis Date  . Anxiety   . Hypertension    Past Surgical History:  Past Surgical History:  Procedure Laterality Date  . WISDOM TOOTH EXTRACTION     [name] is a 45 y.o. female patient, arrived to floor in room 5W03 via stretcher, transferred from ED. Patient alert and oriented X 4. No acute distress noted. Complains of pain 8/10 generalized body pain.   Vital signs: Oral temperature 98.2 F (36.8 C), Blood pressure 107/84, Pulse 61, RR 19, SpO2 98 % on room air. Height 5'9", weight 164 lbs (74.39 kg).   Cardiac monitoring: Telemetry box 5W #07 in place. Verified by Rosezella Rumpf  IV access: Left AC; condition patent and no redness. Dressing is clean, dry, and intact   Skin: intact, no pressure ulcer noted in sacral area. Second [place]   Patient's ID armband verified with patient/ family, and in place. Information packet given to patient/ family. Fall risk assessed, SR up X2, patient/ family able to verbalize understanding of risks associated with falls and to call nurse or staff to assist before getting out of bed. Patient/ family oriented to room and equipment. Call bell within reach.

## 2015-10-25 ENCOUNTER — Other Ambulatory Visit (HOSPITAL_COMMUNITY): Payer: 59

## 2015-10-25 DIAGNOSIS — D1803 Hemangioma of intra-abdominal structures: Secondary | ICD-10-CM

## 2015-10-25 DIAGNOSIS — I10 Essential (primary) hypertension: Secondary | ICD-10-CM

## 2015-10-25 DIAGNOSIS — R6510 Systemic inflammatory response syndrome (SIRS) of non-infectious origin without acute organ dysfunction: Secondary | ICD-10-CM

## 2015-10-25 DIAGNOSIS — F419 Anxiety disorder, unspecified: Secondary | ICD-10-CM

## 2015-10-25 DIAGNOSIS — J189 Pneumonia, unspecified organism: Principal | ICD-10-CM

## 2015-10-25 LAB — CBC WITH DIFFERENTIAL/PLATELET
BASOS ABS: 0 10*3/uL (ref 0.0–0.1)
Basophils Relative: 0 %
Eosinophils Absolute: 0.1 10*3/uL (ref 0.0–0.7)
Eosinophils Relative: 1 %
HEMATOCRIT: 34.4 % — AB (ref 36.0–46.0)
Hemoglobin: 11.3 g/dL — ABNORMAL LOW (ref 12.0–15.0)
LYMPHS ABS: 2.4 10*3/uL (ref 0.7–4.0)
LYMPHS PCT: 27 %
MCH: 26 pg (ref 26.0–34.0)
MCHC: 32.8 g/dL (ref 30.0–36.0)
MCV: 79.3 fL (ref 78.0–100.0)
MONO ABS: 0.7 10*3/uL (ref 0.1–1.0)
Monocytes Relative: 7 %
NEUTROS ABS: 5.7 10*3/uL (ref 1.7–7.7)
Neutrophils Relative %: 65 %
Platelets: 201 10*3/uL (ref 150–400)
RBC: 4.34 MIL/uL (ref 3.87–5.11)
RDW: 14 % (ref 11.5–15.5)
WBC: 8.9 10*3/uL (ref 4.0–10.5)

## 2015-10-25 LAB — COMPREHENSIVE METABOLIC PANEL
ALBUMIN: 3.3 g/dL — AB (ref 3.5–5.0)
ALT: 18 U/L (ref 14–54)
ANION GAP: 7 (ref 5–15)
AST: 16 U/L (ref 15–41)
Alkaline Phosphatase: 65 U/L (ref 38–126)
BILIRUBIN TOTAL: 0.7 mg/dL (ref 0.3–1.2)
BUN: <5 mg/dL — ABNORMAL LOW (ref 6–20)
CHLORIDE: 110 mmol/L (ref 101–111)
CO2: 25 mmol/L (ref 22–32)
Calcium: 8.6 mg/dL — ABNORMAL LOW (ref 8.9–10.3)
Creatinine, Ser: 0.78 mg/dL (ref 0.44–1.00)
GFR calc Af Amer: >60 mL/min (ref >60)
GFR calc non Af Amer: >60 mL/min (ref >60)
GLUCOSE: 98 mg/dL (ref 65–99)
POTASSIUM: 3.3 mmol/L — AB (ref 3.5–5.1)
SODIUM: 142 mmol/L (ref 135–145)
TOTAL PROTEIN: 6.2 g/dL — AB (ref 6.5–8.1)

## 2015-10-25 MED ORDER — ONDANSETRON HCL 4 MG PO TABS: 4.0000 mg | ORAL_TABLET | Freq: Four times a day (QID) | ORAL | 0 refills | Status: DC | PRN

## 2015-10-25 MED ORDER — POTASSIUM CHLORIDE CRYS ER 20 MEQ PO TBCR
40.0000 meq | EXTENDED_RELEASE_TABLET | Freq: Once | ORAL | Status: AC
  Administered 2015-10-25: 40 meq via ORAL
  Filled 2015-10-25: qty 2

## 2015-10-25 NOTE — Discharge Instructions (Signed)

## 2015-10-25 NOTE — Progress Notes (Signed)
Rebekah [name] to be D/C'd to home per MD order.  Discussed with the patient and all questions fully answered.  VSS, Skin clean, dry and intact without evidence of skin break down, no evidence of skin tears noted. IV catheter discontinued intact. Site without signs and symptoms of complications. Dressing and pressure applied.  An After Visit Summary was printed and given to the patient. Patient received prescription.  D/c education completed with patient/family including follow up instructions, medication list, d/c activities limitations if indicated, with other d/c instructions as indicated by MD - patient able to verbalize understanding, all questions fully answered.   Patient instructed to return to ED, call 911, or call MD for any changes in condition.   Patient escorted via [place], and D/C home via private auto.  [name] 10/25/2015 3:01 PM

## 2015-10-25 NOTE — Progress Notes (Addendum)
Pt started c/o of severe pain in right axillary and shoulder area. RN entered room and stopped IV infusion of azithromycin and flushed the IV line multiple times and the pain went away. No swelling or discoloration noted on patient's forearm. RN notified pharmacy and Schorr,NP of patient's reaction to IV drug.  Schorr,NP returned page with orders to make dayshift staff aware that IV team may need to look at the patient again to establish better access. Will continue to monitor and treat per MD orders.

## 2015-10-25 NOTE — Care Management Note (Signed)
Case Management Note  Patient Details  Name: Rebekah [name] MRN: [id number] Date of Birth: 12-12-1970  Subjective/Objective:                 Independent patient form home with spouse and family, admitted with PNA, will DC to home today, no home needs identified   Action/Plan:  DC to home self care.   Expected Discharge Date:                  Expected Discharge Plan:  Home/Self Care  In-House Referral:  NA  Discharge planning Services  CM Consult  Post Acute Care Choice:  NA Choice offered to:  NA  DME Arranged:  N/A DME Agency:  NA  HH Arranged:  NA HH Agency:  NA  Status of Service:  Completed, signed off  If discussed at [place] of Stay Meetings, dates discussed:    Additional Comments:  Carles Collet, RN 10/25/2015, 11:59 AM

## 2015-10-25 NOTE — Discharge Summary (Signed)
Physician Discharge Summary  Rebekah [name] [id number] DOB: March 20, 1970 DOA: 10/23/2015  PCP: [name], FNP  Admit date: 10/23/2015 Discharge date: 10/25/2015  Admitted From: home Disposition:  home  Recommendations for Outpatient Follow-up:  1. Follow up with PCP in 1-2 weeks 2. Completes 5 days of levaquin On 10/22.   Home Health: None  Equipment/Devices: None  Discharge Condition: Fair CODE STATUS: Full code Diet recommendation: Regular    Discharge Diagnoses:  Principal Problem:   CAP (community acquired pneumonia)   Active Problems:   Multilobar pneumonia   GERD   Hypertension   Anxiety   Hypokalemia   Abnormal EKG   Nausea and vomiting   Cavernous hemangioma of liver  Brief narrative/history of present illness Please refer to admission H&P for details, in brief,45 year old female with history of anxiety, GERD and hypertension return to the ED with multiple episodes of nausea and vomiting with increased anxiety and an episode of syncope. Patient also reported palpitations. Reports subjective fever. She presented to the ED earlier with several episodes of nausea and vomiting, was managed symptomatically and discharged home. In the ED she was mildly tachycardic, afebrile. Blood work showed leukocytosis with WBC of 20 5K and hypokalemia. CT angiogram of the chest done was negative for PE but showed patchy bilateral infiltrate suggestive of multilobar pneumonia. Admitted to hospitalist service.  Principal Problem: SIRS with Multilobar pneumonia Placed on empiric Rocephin and azithromycin. Supportive care with IV fluids might Tylenol. Cultures negative. Leukocytosis improved. Patient was discharged on oral Levaquin from the ED for 5 day course. Instructed her to complete the antibiotic course upon discharge. Follow-up with PCP in 1 week.  Active Problems: Intractable nausea and vomiting Suspect this is due to pneumonia. Symptoms now resolved and  tolerating advanced diet.    GERD Continue Pepcid.  Essential hypertension Resume home medications.       Hypokalemia Replenished   Hepatic lesion Right hepatic lobe lesion measuring 1.7 cm. MRI abdomen shows cavernous hemangioma. Asymptomatic. Follow-up as needed.    Family Communication  : Husband and daughter at bedside  Disposition Plan  : Home  Consults  : None  Procedures  :  CT angiogram of the chest MRI abdomen  Discharge Instructions     Medication List    TAKE these medications   amLODipine 5 MG tablet Commonly known as:  NORVASC Take 5 mg by mouth at bedtime.   [employer] VITAMIN D 2000 units Caps Generic drug:  Cholecalciferol Take 2,000 Units by mouth daily.   diazepam 5 MG tablet Commonly known as:  VALIUM Take 5 mg by mouth 2 (two) times daily as needed for anxiety.   diclofenac 75 MG EC tablet Commonly known as:  VOLTAREN Take 75 mg by mouth 2 (two) times daily as needed (knee pain).   Diclofenac Sodium 3 % Gel Apply 1 application topically 2 (two) times daily as needed (knee pain).   HYDROcodone-homatropine 5-1.5 MG/5ML syrup Commonly known as:  HYCODAN Take 5 mLs by mouth every 6 (six) hours as needed for cough.   ibuprofen 200 MG tablet Commonly known as:  ADVIL,MOTRIN Take 200-400 mg by mouth every 6 (six) hours as needed for headache (pain).   Ipratropium-Albuterol 20-100 MCG/ACT Aers respimat Commonly known as:  COMBIVENT RESPIMAT Inhale 1 puff into the lungs every 6 (six) hours as needed for wheezing or shortness of breath (or coughing).   levofloxacin 750 MG tablet Commonly known as:  LEVAQUIN Take 1 tablet (750 mg total) by mouth  daily.   loratadine 10 MG tablet Commonly known as:  CLARITIN Take 10 mg by mouth daily as needed for allergies.   ondansetron 4 MG tablet Commonly known as:  ZOFRAN Take 1 tablet (4 mg total) by mouth every 6 (six) hours as needed for nausea or vomiting.   promethazine 25 MG  tablet Commonly known as:  PHENERGAN Take 1 tablet (25 mg total) by mouth every 6 (six) hours as needed for nausea.   traMADol 50 MG tablet Commonly known as:  ULTRAM Take 100 mg by mouth every 6 (six) hours as needed (for pain).      Follow-up Information    [name], FNP. Schedule an appointment as soon as possible for a visit in 1 week(s).   Specialty:  Nurse Practitioner Contact information: [place] [id number] [phone]          Allergies  Allergen Reactions  . Latex Rash      Procedures/Studies: Dg Abdomen Acute W/chest  Result Date: 10/23/2015 CLINICAL DATA:  Midsternal chest pain and epigastric pain with emesis and diarrhea since last night. EXAM: DG ABDOMEN ACUTE W/ 1V CHEST COMPARISON:  Chest radiograph 12/17/2013. FINDINGS: Trachea is midline. Heart size normal. There is a new nodular opacity in the left midlung zone. Probable asymmetric prominence of the right costochondral junction, rather than an additional nodule. Lungs are otherwise clear. No pleural fluid. Upright and supine views of the abdomen show no free air. There may be minimal gaseous prominence of the colon. No small bowel dilatation. No unexpected radiopaque calculi. IMPRESSION: 1. Minimal gaseous prominence of the colon, nonspecific. No air-fluid levels or small bowel obstruction. 2. New nodular density in the left midlung zone. Malignancy cannot be excluded. Nonemergent CT chest without contrast is recommended in further evaluation. These results will be called to the ordering clinician or representative by the Radiologist Assistant, and communication documented in the PACS or zVision Dashboard. Electronically Signed   By: Lorin Picket M.D.   On: 10/23/2015 08:25   Mr Abdomen Mrcp Moise Boring Contast  Result Date: 10/24/2015 CLINICAL DATA:  Liver lesion on recent chest CT EXAM: MRI ABDOMEN WITHOUT AND WITH CONTRAST (INCLUDING MRCP) TECHNIQUE: Multiplanar  multisequence MR imaging of the abdomen was performed both before and after the administration of intravenous contrast. Heavily T2-weighted images of the biliary and pancreatic ducts were obtained, and three-dimensional MRCP images were rendered by post processing. CONTRAST:  8 cc Eovist COMPARISON:  Chest CT 10/23/2015 FINDINGS: Lower chest:  Unremarkable Hepatobiliary: 12 mm T1 hypo intense, T2 hyperintense lesion in the right liver (segment VIII) has imaging features consistent with cavernous hemangioma. Gallbladder nondistended. No intrahepatic or extrahepatic biliary dilation. Pancreas: No focal mass lesion. No dilatation of the main duct. No intraparenchymal cyst. No peripancreatic edema. Spleen: No splenomegaly. No focal mass lesion. Adrenals/Urinary Tract: No adrenal nodule or mass. Kidneys are unremarkable Stomach/Bowel: Stomach is nondistended. No gastric wall thickening. No evidence of outlet obstruction. Duodenum is normally positioned as is the ligament of Treitz. Vascular/Lymphatic: No abdominal aortic aneurysm. Other: No intraperitoneal free fluid. Musculoskeletal: No abnormal marrow enhancement within the visualized bony anatomy. IMPRESSION: The 12 mm lesion identified in the right liver on the recent CT scan represents cavernous hemangioma. Electronically Signed   By: Misty Stanley M.D.   On: 10/24/2015 13:27   Ct Angio Chest/abd/pel For Dissection W And/or Wo Contrast  Result Date: 10/23/2015 CLINICAL DATA:  Vomiting since last evening. Now with chest and  abdominal pain. EXAM: CT ANGIOGRAPHY CHEST, ABDOMEN AND PELVIS TECHNIQUE: Multidetector CT imaging through the chest, abdomen and pelvis was performed using the standard protocol during bolus administration of intravenous contrast. Multiplanar reconstructed images and MIPs were obtained and reviewed to evaluate the vascular anatomy. CONTRAST:  100 mL of Isovue 370 COMPARISON:  None. FINDINGS: CTA CHEST FINDINGS Cardiovascular: The heart size  is normal. No effusions. No coronary artery calcifications identified. The heart is normal in appearance. Mediastinum/Nodes: The thoracic aorta is normal in caliber. No dissection is identified within the thoracic aorta. The vessel arising from the thoracic aorta are normal in appearance as well. The pulmonary arteries are normal in caliber. No filling defects seen on limited views of the pulmonary arteries. The visualized esophagus is normal in appearance. No mediastinal air identified. Lungs/Pleura: Patchy bilateral infiltrates are seen most marked in the upper lobes. Minimal opacity also seen in the left lower lobe. The bilateral pulmonary opacities limit evaluation for pulmonary nodules or masses. Within this limitation, no suspicious nodule or mass is identified. No pneumothorax. The central airways are normal. Musculoskeletal: No chest wall abnormality. No acute or significant osseous findings. Review of the MIP images confirms the above findings. CTA ABDOMEN AND PELVIS FINDINGS VASCULAR Aorta: Normal caliber aorta without aneurysm, dissection, vasculitis or significant stenosis. Celiac: Patent without evidence of aneurysm, dissection, vasculitis or significant stenosis. SMA: Patent without evidence of aneurysm, dissection, vasculitis or significant stenosis. Renals: 3 right renal arteries arise from the aorta. 2 left renal arteries arise from the aorta. IMA: Patent without evidence of aneurysm, dissection, vasculitis or significant stenosis. Inflow: Patent without evidence of aneurysm, dissection, vasculitis or significant stenosis. Veins: Poorly opacified limiting evaluation. However, within this limitation, the IVC and remainder of the veins are unremarkable. Review of the MIP images confirms the above findings. NON-VASCULAR Hepatobiliary: Evaluation is limited due to arterial phase imaging. There is a mass in the right hepatic lobe with an attenuation of 34 Hounsfield units, measuring 17 mm on series 6,  image 129. No other definitive hepatic masses identified. The gallbladder is normal in appearance. Pancreas: Unremarkable. No pancreatic ductal dilatation or surrounding inflammatory changes. Spleen: Normal in size without focal abnormality. Adrenals/Urinary Tract: Adrenal glands are unremarkable. Kidneys are normal, without renal calculi, focal lesion, or hydronephrosis. Bladder is unremarkable. Stomach/Bowel: The stomach is normal in appearance. No small bowel obstruction. The colon is normal in appearance. The appendix is well seen with no evidence of appendicitis. Lymphatic: No significant vascular findings are present. No enlarged abdominal or pelvic lymph nodes. Reproductive: Uterus and bilateral adnexa are unremarkable. Other: There is a fat containing umbilical hernia. Musculoskeletal: No acute or significant osseous findings. Review of the MIP images confirms the above findings. IMPRESSION: 1. Bilateral patchy pulmonary opacities, particularly in the upper lobes, consistent with multi focal pneumonia versus aspiration. Recommend clinical correlation. Recommend follow-up to resolution. 2. No aortic aneurysm or dissection. 3. 1.7 cm nonspecific mass in the right hepatic lobe. This is most likely a benign etiology such as a complicated cyst or hemangioma. An MRI could further characterize to exclude neoplasm. Electronically Signed   By: Dorise Bullion III M.D   On: 10/23/2015 11:39     Subjective: No further nausea or vomiting. Feels better today.  Discharge Exam: Vitals:   10/24/15 2121 10/25/15 0506  BP: (!) 148/95 (!) 143/85  Pulse: 76 80  Resp: 18 18  Temp: 98.8 F (37.1 C) 98.2 F (36.8 C)   Vitals:   10/24/15 1430 10/24/15 1641  10/24/15 2121 10/25/15 0506  BP: 120/82  (!) 148/95 (!) 143/85  Pulse: 86 77 76 80  Resp:   18 18  Temp:  99 F (37.2 C) 98.8 F (37.1 C) 98.2 F (36.8 C)  TempSrc:  Oral Oral Oral  SpO2:  97% 96% 98%  Weight:      Height:        General:Middle  aged female not in distress HEENT: Moist mucosa, supple neck Chest: Fine bibasilar crackles, no rhonchi or wheeze CVS: Normal S1 and S2, no murmurs or gallop GI: Soft, nondistended, nontender, bowel sounds present musculoskeletal: Warm, no edema    The results of significant diagnostics from this hospitalization (including imaging, microbiology, ancillary and laboratory) are listed below for reference.     Microbiology: Recent Results (from the past 240 hour(s))  Blood culture (routine x 2)     Status: None (Preliminary result)   Collection Time: 10/23/15  9:30 PM  Result Value Ref Range Status   Specimen Description BLOOD RIGHT ARM  Final   Special Requests IN PEDIATRIC BOTTLE 2CC  Final   Culture NO GROWTH < 24 HOURS  Final   Report Status PENDING  Incomplete  Blood culture (routine x 2)     Status: None (Preliminary result)   Collection Time: 10/23/15 10:16 PM  Result Value Ref Range Status   Specimen Description BLOOD LEFT ARM  Final   Special Requests IN PEDIATRIC BOTTLE 1CC  Final   Culture NO GROWTH < 24 HOURS  Final   Report Status PENDING  Incomplete     Labs: BNP (last 3 results) No results for input(s): BNP in the last 8760 hours. Basic Metabolic Panel:  Recent Labs Lab 10/23/15 0604 10/23/15 2112 10/24/15 0529 10/25/15 0526  NA 138 138 141 142  K 3.4* 3.0* 3.5 3.3*  CL 104 105 110 110  CO2 24 22 23 25   GLUCOSE 214* 130* 132* 98  BUN 5* 5* <5* <5*  CREATININE 0.83 0.85 0.80 0.78  CALCIUM 9.4 8.8* 8.1* 8.6*  MG 1.9  --   --   --    Liver Function Tests:  Recent Labs Lab 10/23/15 0604 10/24/15 0529 10/25/15 0526  AST 23 22 16   ALT 23 18 18   ALKPHOS 88 64 65  BILITOT 0.9 0.4 0.7  PROT 7.2 6.4* 6.2*  ALBUMIN 4.0 3.2* 3.3*    Recent Labs Lab 10/23/15 0604  LIPASE 15   No results for input(s): AMMONIA in the last 168 hours. CBC:  Recent Labs Lab 10/23/15 0604 10/23/15 2112 10/24/15 0529 10/25/15 0526  WBC 25.1* 17.9* 13.8* 8.9   NEUTROABS 23.3* 15.8* 10.6* 5.7  HGB 13.0 12.3 10.9* 11.3*  HCT 38.5 37.1 33.1* 34.4*  MCV 77.8* 79.1 79.6 79.3  PLT 274 252 213 201   Cardiac Enzymes: No results for input(s): CKTOTAL, CKMB, CKMBINDEX, TROPONINI in the last 168 hours. BNP: Invalid input(s): POCBNP CBG: No results for input(s): GLUCAP in the last 168 hours. D-Dimer No results for input(s): DDIMER in the last 72 hours. Hgb A1c No results for input(s): HGBA1C in the last 72 hours. Lipid Profile No results for input(s): CHOL, HDL, LDLCALC, TRIG, CHOLHDL, LDLDIRECT in the last 72 hours. Thyroid function studies No results for input(s): TSH, T4TOTAL, T3FREE, THYROIDAB in the last 72 hours.  Invalid input(s): FREET3 Anemia work up No results for input(s): VITAMINB12, FOLATE, FERRITIN, TIBC, IRON, RETICCTPCT in the last 72 hours. Urinalysis    Component Value Date/Time   COLORURINE YELLOW  10/23/2015 [place] 10/23/2015 1046   LABSPEC 1.020 10/23/2015 1046   PHURINE 8.0 10/23/2015 1046   GLUCOSEU 100 (A) 10/23/2015 1046   HGBUR SMALL (A) 10/23/2015 1046   BILIRUBINUR NEGATIVE 10/23/2015 1046   KETONESUR 15 (A) 10/23/2015 1046   PROTEINUR NEGATIVE 10/23/2015 1046   UROBILINOGEN 0.2 09/15/2008 0916   NITRITE NEGATIVE 10/23/2015 1046   LEUKOCYTESUR NEGATIVE 10/23/2015 1046   Sepsis Labs Invalid input(s): PROCALCITONIN,  WBC,  LACTICIDVEN Microbiology Recent Results (from the past 240 hour(s))  Blood culture (routine x 2)     Status: None (Preliminary result)   Collection Time: 10/23/15  9:30 PM  Result Value Ref Range Status   Specimen Description BLOOD RIGHT ARM  Final   Special Requests IN PEDIATRIC BOTTLE 2CC  Final   Culture NO GROWTH < 24 HOURS  Final   Report Status PENDING  Incomplete  Blood culture (routine x 2)     Status: None (Preliminary result)   Collection Time: 10/23/15 10:16 PM  Result Value Ref Range Status   Specimen Description BLOOD LEFT ARM  Final   Special Requests  IN PEDIATRIC BOTTLE 1CC  Final   Culture NO GROWTH < 24 HOURS  Final   Report Status PENDING  Incomplete     Time coordinating discharge: Over 30 minutes  SIGNED:   Louellen Molder, MD  [facility] 10/25/2015, 1:36 PM Pager   If 7PM-7AM, please contact night-coverage [email or web address] Password [email or web address]

## 2015-10-28 LAB — CULTURE, BLOOD (ROUTINE X 2)
CULTURE: NO GROWTH
CULTURE: NO GROWTH

## 2016-04-11 LAB — HIV ANTIBODY (ROUTINE TESTING W REFLEX): HIV SCREEN 4TH GENERATION: NONREACTIVE

## 2016-07-24 ENCOUNTER — Encounter: Payer: Self-pay | Admitting: Physical Therapy

## 2016-07-24 ENCOUNTER — Ambulatory Visit: Payer: 59 | Attending: Obstetrics and Gynecology | Admitting: Physical Therapy

## 2016-07-24 DIAGNOSIS — R252 Cramp and spasm: Secondary | ICD-10-CM | POA: Diagnosis present

## 2016-07-24 DIAGNOSIS — M6281 Muscle weakness (generalized): Secondary | ICD-10-CM | POA: Insufficient documentation

## 2016-07-24 DIAGNOSIS — M545 Low back pain, unspecified: Secondary | ICD-10-CM

## 2016-07-24 NOTE — Therapy (Signed)
[facility] [address], [place], [place], [address] Phone: [phone]   Fax:  [phone]  Physical Therapy Evaluation  Patient Details  Name: Rebekah [name] MRN: [id number] Date of Birth: 10/22/1970 Referring Provider: Dr. Servando Salina  Encounter Date: 07/24/2016      PT End of Session - 07/24/16 1108    Visit Number 1   Date for PT Re-Evaluation 10/16/16   Authorization Type UHC   PT Start Time 1112  came late and had to fill out FOTO   PT Stop Time 1145   PT Time Calculation (min) 33 min   Activity Tolerance Patient tolerated treatment well;Patient limited by pain   Behavior During Therapy [facility] for tasks assessed/performed;Anxious      Past Medical History:  Diagnosis Date  . Anxiety   . Hypertension     Past Surgical History:  Procedure Laterality Date  . WISDOM TOOTH EXTRACTION      There were no vitals filed for this visit.       Subjective Assessment - 07/24/16 1115    Subjective Patient reports her pain started end of 02/2016.  At first thougt pain was from a UTI and cleared up a little.  The pain started to continuously increase.  MD said vaginal walls were thing.  Stomach swells.  Difficult to sit.    Limitations Sitting   How long can you sit comfortably? pressure   Patient Stated Goals reduce pain    Currently in Pain? Yes   Pain Score 8    Pain Location Abdomen  back   Pain Orientation Lower   Pain Descriptors / Indicators Pressure   Pain Type Chronic pain   Pain Onset More than a month ago   Pain Frequency Constant   Aggravating Factors  sitting, standing for long period of time, pain the day after intercourse,    Pain Relieving Factors medication, when not carrying heavy things   Multiple Pain Sites No            [facility] PT Assessment - 07/24/16 0001      Assessment   Medical Diagnosis severe pelvic pain/LBP   Referring Provider Dr. Servando Salina   Onset  Date/Surgical Date 02/24/16   Prior Therapy none     Precautions   Precautions None     Restrictions   Weight Bearing Restrictions No     Balance Screen   Has the patient fallen in the past 6 months No   Has the patient had a decrease in activity level because of a fear of falling?  No   Is the patient reluctant to leave their home because of a fear of falling?  No     Home Ecologist residence     Prior Function   Level of Independence Independent   Vocation Requirements standing, hairdresser     Cognition   Overall Cognitive Status Within Functional Limits for tasks assessed     Observation/Other Assessments   Focus on Therapeutic Outcomes (FOTO)  100% limitation for PFDI pain survey     Posture/Postural Control   Posture/Postural Control Postural limitations   Postural Limitations Forward head;Rounded Shoulders     ROM / Strength   AROM / PROM / Strength AROM;PROM;Strength     AROM   Lumbar Flexion decreased by 75%   Lumbar Extension decreased by 75%   Lumbar - Right Side Bend decreased by 50%   Lumbar - Left Side Bend  decreased by 50%   Lumbar - Right Rotation decreased by 50%   Lumbar - Left Rotation decreased by 50%     PROM   Right Hip External Rotation  50   Right Hip Internal Rotation  5   Left Hip Flexion 98   Left Hip External Rotation  45   Left Hip Internal Rotation  10     Strength   Strength Assessment Site Hip   Right Hip Flexion 4/5   Right Hip External Rotation  4/5   Right Hip ABduction 3/5   Left Hip Flexion 4/5   Left Hip External Rotation 4/5   Left Hip ABduction 3/5     Right Hip   Right Hip Flexion 90     Palpation   SI assessment  right ilium is anteriorly rotated;    Palpation comment abdomen is tender especially lower abdomen; lumbar area; bil. buttocks;      Transfers   Transfers --  guarded with movement     Ambulation/Gait   Ambulation/Gait Yes   Gait Pattern Trunk flexed             Objective measurements completed on examination: See above findings.        Pelvic Floor Special Questions - 07/24/16 0001    Prior Pregnancies Yes   Number of Pregnancies 5   Number of Vaginal Deliveries 2   Currently Sexually Active Yes   Marinoff Scale no problems   Urinary Leakage No   Urinary urgency Yes   Urinary frequency uses bathroom 3 times per hour due to when she feels the urge she does not want to wait  urine flow is fast and heavy   Fecal incontinence No   External Perineal Exam unable to bulge perineum   Skin Integrity Intact   Perineal Body/Introitus  Elevated   External Palpation no palpable tenderness   Prolapse None   Pelvic Floor Internal Exam Patient confirms identification and approves PT to assess muslce integrity   Exam Type Vaginal   Palpation tenderness located on bil. urethra sphincter, bil. obturator internist, bil. levator ani, introitus    Strength strong squeeze, against strong resistance   Strength # of seconds 2   Tone hypertonic                    PT Short Term Goals - 07/24/16 1512      PT SHORT TERM GOAL #1   Title independent with initial HEP with relaxation, breathing, stretches   Time 4   Period Weeks   Status New     PT SHORT TERM GOAL #2   Title pain with sitting decreased >/= 25% due to ability to relax pelvic floor and increased bil. hip flexion   Time 4   Period Weeks   Status New     PT SHORT TERM GOAL #3   Title stand at work with pain decreased >/= 25% due to reduction in trigger points in pelvic floor muscles   Time 4   Period Weeks   Status New     PT SHORT TERM GOAL #4   Title abdominal swelling and pain decreased >/= 25% due to reduction in trigger points   Time 4   Period Weeks   Status New     PT SHORT TERM GOAL #5   Title understand how to perform abdominal massage to improve bowel movements and improve lymph drainage   Time 4   Period Weeks   Status  New           PT  Long Term Goals - 07/24/16 1517      PT LONG TERM GOAL #1   Title independent with HEP and understand how to progress   Time 12   Period Weeks   Status New     PT LONG TERM GOAL #2   Title ability to stand with pain decreased >/= 75% due to improve tissue mobility and tightness   Time 12   Period Weeks   Status New     PT LONG TERM GOAL #3   Title ability to sit with pain decreased >/= 75% due to pelvis in correct position and relax the pelvic floor   Time 12   Period Weeks   Status New     PT LONG TERM GOAL #4   Title day after intercourse pain decreased >/= 75% due to ability to relax the muscles    Time 12   Period Weeks   Status New     PT LONG TERM GOAL #5   Title ability to have a bowel movement with >/= 75% less strain due to ability to bulge the pelvic floor   Time 12   Period Weeks   Status New                Plan - 07/24/16 1352    Clinical Impression Statement Patient is a 46 year old female with chronic pelvic, lower abdominal and low back pain since 02/2016 with sudden onset.  Patient reports her pain is constant at level 8/10. Pain is wosre with standing and sitting long period of time and transitional movements, carrying heavy items and pain the following day after intercourse.  Patient has decreased mobility of hips due to fascial restrictions and pain. Patient abdomen is swollen and tender to palpation. Patient reports increased feeling of frequent urination.  Recently more difficulty to have a bowel movement.  Increased pain with low cut panties.  Pelvic floor strength is 5/5 but only able to hold 2 seconds and quivers.  Weakness in bilateral hips and abdomen.  Tenderness located in bilateral obturator internist, levator ani, urethra sphincter, and introitus.  Patient is not able to bulge her perineum due to increased hypertonicity this week.  Patient will benefit from skilled therapy to reduce pain, muscle trigger points, and increased function when muscle  tension is released.     History and Personal Factors relevant to plan of care: Chronic pain since 02/2016   Clinical Presentation Stable   Clinical Presentation due to: stable condition   Clinical Decision Making Low   Rehab Potential Excellent   PT Frequency 2x / week   PT Duration 12 weeks   PT Treatment/Interventions Biofeedback;Cryotherapy;Electrical Stimulation;Moist Heat;Ultrasound;Patient/family education;Neuromuscular re-education;Therapeutic exercise;Therapeutic activities;Manual techniques;Passive range of motion;Dry needling;Energy conservation;Taping   PT Next Visit Plan diaphragmatic breathing, pelvic floor meditation, stretches, soft tissue work, tissue rolling   PT Home Exercise Plan progress as needed   Consulted and Agree with Plan of Care Patient      Patient will benefit from skilled therapeutic intervention in order to improve the following deficits and impairments:  Decreased range of motion, Increased fascial restricitons, Impaired tone, Decreased endurance, Increased muscle spasms, Pain, Decreased activity tolerance, Hypomobility, Impaired flexibility, Decreased strength, Decreased mobility  Visit Diagnosis: Cramp and spasm  Muscle weakness (generalized)  Acute bilateral low back pain without sciatica     Problem List Patient Active Problem List   Diagnosis Date Noted  .  Cavernous hemangioma of liver 10/25/2015  . SIRS (systemic inflammatory response syndrome) (HCC)   . CAP (community acquired pneumonia) 10/23/2015  . Hypertension 10/23/2015  . Anxiety 10/23/2015  . Hypokalemia 10/23/2015  . Abnormal EKG 10/23/2015  . Nausea and vomiting 10/23/2015  . Sinus infection 07/25/2011  . Tooth pain 07/25/2011  . ANEMIA-IRON DEFICIENCY 04/19/2009  . ALLERGIC RHINITIS 04/19/2009  . GERD 04/19/2009  . HOT FLASHES 04/19/2009  . WRIST PAIN, RIGHT, CHRONIC 04/19/2009  . KNEE PAIN, BILATERAL 04/19/2009  . BACK PAIN 04/19/2009    Earlie Counts, PT 07/24/16 3:22  PM   [facility] [address] [place], [place], [address] Phone: [phone]   Fax:  [phone]  Name: Rebekah [name] MRN: [id number] Date of Birth: [birth year]

## 2016-07-25 ENCOUNTER — Ambulatory Visit: Payer: 59 | Admitting: Physical Therapy

## 2016-07-25 ENCOUNTER — Encounter: Payer: Self-pay | Admitting: Physical Therapy

## 2016-07-25 DIAGNOSIS — M545 Low back pain, unspecified: Secondary | ICD-10-CM

## 2016-07-25 DIAGNOSIS — R252 Cramp and spasm: Secondary | ICD-10-CM | POA: Diagnosis not present

## 2016-07-25 DIAGNOSIS — M6281 Muscle weakness (generalized): Secondary | ICD-10-CM

## 2016-07-25 NOTE — Therapy (Addendum)
[facility] [address] [place], [place], [address] Phone: [phone]   Fax:  [phone]  Physical Therapy Treatment  Patient Details  Name: Rebekah [name] MRN: [id number] Date of Birth: 11/27/1970 Referring Provider: Dr. Servando Salina  Encounter Date: 07/25/2016      PT End of Session - 07/25/16 1240    Visit Number 2   Date for PT Re-Evaluation 10/16/16   Authorization Type UHC   Authorization - Visit Number 2   Authorization - Number of Visits 30   PT Start Time 0086   PT Stop Time 1255   PT Time Calculation (min) 70 min   Activity Tolerance Patient tolerated treatment well;Patient limited by pain   Behavior During Therapy [facility] for tasks assessed/performed;Anxious      Past Medical History:  Diagnosis Date  . Anxiety   . Hypertension     Past Surgical History:  Procedure Laterality Date  . WISDOM TOOTH EXTRACTION      There were no vitals filed for this visit.      Subjective Assessment - 07/25/16 1152    Subjective I almost canceled due to my pain level being so bad.    Limitations Sitting   How long can you sit comfortably? pressure   Patient Stated Goals reduce pain    Currently in Pain? Yes   Pain Score 10-Worst pain ever   Pain Location Abdomen   Pain Orientation Lower   Pain Descriptors / Indicators Pressure   Pain Type Chronic pain   Pain Onset More than a month ago   Pain Frequency Constant   Aggravating Factors  sitting, standing for long period of time, pain the day after intercourse   Pain Relieving Factors medication, when not carrying heavy things   Multiple Pain Sites No                         [facility] Adult PT Treatment/Exercise - 07/25/16 0001      Neuro Re-ed    Neuro Re-ed Details  diaphgramatic breathing while performing soft tissue work     Modalities   Modalities Electrical Stimulation;Moist Heat     Moist Heat Therapy   Number Minutes  Moist Heat 20 Minutes   Moist Heat Location Lumbar Spine  abdominal in [place] Stimulation Location lumbar and abdominal in hookly   Electrical Stimulation Action premod   Electrical Stimulation Parameters 20 min, to patient tolerance   Electrical Stimulation Goals Pain     Manual Therapy   Manual Therapy Soft tissue mobilization;Myofascial release   Soft tissue mobilization abdominal wall with circular massage and along the obliques   Myofascial Release fascial release of the respiratory diaphgram and urogenital diaphgram, release of the bladder from the vagina and rectum; release of bil. psoas and mid abdomen                PT Education - 07/25/16 1238    Education provided Yes   Education Details diaphgramatic breathing   Person(s) Educated Patient   Methods Explanation;Demonstration;Verbal cues;Handout   Comprehension Returned demonstration;Verbalized understanding          PT Short Term Goals - 07/24/16 1512      PT SHORT TERM GOAL #1   Title independent with initial HEP with relaxation, breathing, stretches   Time 4   Period Weeks   Status New     PT SHORT TERM  GOAL #2   Title pain with sitting decreased >/= 25% due to ability to relax pelvic floor and increased bil. hip flexion   Time 4   Period Weeks   Status New     PT SHORT TERM GOAL #3   Title stand at work with pain decreased >/= 25% due to reduction in trigger points in pelvic floor muscles   Time 4   Period Weeks   Status New     PT SHORT TERM GOAL #4   Title abdominal swelling and pain decreased >/= 25% due to reduction in trigger points   Time 4   Period Weeks   Status New     PT SHORT TERM GOAL #5   Title understand how to perform abdominal massage to improve bowel movements and improve lymph drainage   Time 4   Period Weeks   Status New           PT Long Term Goals - 07/24/16 1517      PT LONG TERM GOAL #1   Title independent with HEP  and understand how to progress   Time 12   Period Weeks   Status New     PT LONG TERM GOAL #2   Title ability to stand with pain decreased >/= 75% due to improve tissue mobility and tightness   Time 12   Period Weeks   Status New     PT LONG TERM GOAL #3   Title ability to sit with pain decreased >/= 75% due to pelvis in correct position and relax the pelvic floor   Time 12   Period Weeks   Status New     PT LONG TERM GOAL #4   Title day after intercourse pain decreased >/= 75% due to ability to relax the muscles    Time 12   Period Weeks   Status New     PT LONG TERM GOAL #5   Title ability to have a bowel movement with >/= 75% less strain due to ability to bulge the pelvic floor   Time 12   Period Weeks   Status New               Plan - 07/25/16 1240    Clinical Impression Statement Patient had increased swelling of the abdomen but after swelling decreased.  After therapy the back pain was 0/10 and abdominal pain was 7/10.  Patient had difficulty moving due to the increased pain. Patient will benfit from skilled therapy to reduce pain, muscle trigger points, and increased function when muscle tension is released.    Rehab Potential Excellent   PT Frequency 2x / week   PT Duration 12 weeks   PT Treatment/Interventions Biofeedback;Cryotherapy;Electrical Stimulation;Moist Heat;Ultrasound;Patient/family education;Neuromuscular re-education;Therapeutic exercise;Therapeutic activities;Manual techniques;Passive range of motion;Dry needling;Energy conservation;Taping   PT Next Visit Plan pelvic floor meditation, stretches, soft tissue work, tissue rolling   PT Home Exercise Plan progress as needed   Recommended Other Services inital cert signed on 2/77/8242   Consulted and Agree with Plan of Care Patient      Patient will benefit from skilled therapeutic intervention in order to improve the following deficits and impairments:  Decreased range of motion, Increased fascial  restricitons, Impaired tone, Decreased endurance, Increased muscle spasms, Pain, Decreased activity tolerance, Hypomobility, Impaired flexibility, Decreased strength, Decreased mobility  Visit Diagnosis: Cramp and spasm  Muscle weakness (generalized)  Acute bilateral low back pain without sciatica     Problem List Patient Active Problem  List   Diagnosis Date Noted  . Cavernous hemangioma of liver 10/25/2015  . SIRS (systemic inflammatory response syndrome) (HCC)   . CAP (community acquired pneumonia) 10/23/2015  . Hypertension 10/23/2015  . Anxiety 10/23/2015  . Hypokalemia 10/23/2015  . Abnormal EKG 10/23/2015  . Nausea and vomiting 10/23/2015  . Sinus infection 07/25/2011  . Tooth pain 07/25/2011  . ANEMIA-IRON DEFICIENCY 04/19/2009  . ALLERGIC RHINITIS 04/19/2009  . GERD 04/19/2009  . HOT FLASHES 04/19/2009  . WRIST PAIN, RIGHT, CHRONIC 04/19/2009  . KNEE PAIN, BILATERAL 04/19/2009  . BACK PAIN 04/19/2009    Earlie Counts, PT 07/25/16 12:54 PM   [facility] [address], [place], [place], [address] Phone: [phone]   Fax:  [phone]  Name: Rebekah [name] MRN: [id number] Date of Birth: 11-05-1970  PHYSICAL THERAPY DISCHARGE SUMMARY  Visits from Start of Care: 2  Current functional level related to goals / functional outcomes: See above.  Patient has not returned since her last visit on 07/25/2016   Remaining deficits: See above   Education / Equipment: HEP Plan:                                                    Patient goals were not met. Patient is being discharged due to not returning since the last visit.  Thank you for the referral. Earlie Counts, PT 10/17/16 8:04 AM  ?????

## 2016-07-25 NOTE — Patient Instructions (Addendum)
Hook-Lying    Lie with hips and knees bent. Allow body's muscles to relax. Place hands on belly. Inhale slowly and deeply for __5_ seconds, so hands move up. Then take _5__ seconds to exhale. Repeat _10__ times. Do _3__ times a day.   Copyright  VHI. All rights reserved.   [facility] [address] [place], [place] [address] Phone # [phone] Fax [phone]

## 2016-08-02 ENCOUNTER — Ambulatory Visit: Payer: 59 | Admitting: Physical Therapy

## 2016-08-22 ENCOUNTER — Encounter: Payer: 59 | Admitting: Physical Therapy

## 2016-08-23 ENCOUNTER — Ambulatory Visit: Payer: 59 | Admitting: Physical Therapy

## 2016-08-29 ENCOUNTER — Encounter: Payer: 59 | Admitting: Physical Therapy

## 2016-09-04 ENCOUNTER — Encounter: Payer: 59 | Admitting: Physical Therapy

## 2016-09-05 ENCOUNTER — Encounter: Payer: 59 | Admitting: Physical Therapy

## 2016-09-13 ENCOUNTER — Encounter: Payer: 59 | Admitting: Physical Therapy

## 2016-09-13 ENCOUNTER — Telehealth: Payer: Self-pay | Admitting: Physical Therapy

## 2016-09-13 NOTE — Telephone Encounter (Signed)
Called patient and left a message.  Wanted to talk to patient about her frequent cancels and she will be discharged.  Earlie Counts, PT @9 /05/2016@ 2:04 PM

## 2017-06-13 DIAGNOSIS — Z6826 Body mass index (BMI) 26.0-26.9, adult: Secondary | ICD-10-CM | POA: Diagnosis not present

## 2017-06-13 DIAGNOSIS — Z1231 Encounter for screening mammogram for malignant neoplasm of breast: Secondary | ICD-10-CM | POA: Diagnosis not present

## 2017-06-13 DIAGNOSIS — R3915 Urgency of urination: Secondary | ICD-10-CM | POA: Diagnosis not present

## 2017-06-13 DIAGNOSIS — R350 Frequency of micturition: Secondary | ICD-10-CM | POA: Diagnosis not present

## 2017-06-13 DIAGNOSIS — Z01419 Encounter for gynecological examination (general) (routine) without abnormal findings: Secondary | ICD-10-CM | POA: Diagnosis not present

## 2017-06-25 DIAGNOSIS — N3011 Interstitial cystitis (chronic) with hematuria: Secondary | ICD-10-CM | POA: Diagnosis not present

## 2017-06-25 DIAGNOSIS — R1030 Lower abdominal pain, unspecified: Secondary | ICD-10-CM | POA: Diagnosis not present

## 2017-06-25 DIAGNOSIS — R350 Frequency of micturition: Secondary | ICD-10-CM | POA: Diagnosis not present

## 2017-06-25 DIAGNOSIS — R300 Dysuria: Secondary | ICD-10-CM | POA: Diagnosis not present

## 2017-06-27 DIAGNOSIS — R3121 Asymptomatic microscopic hematuria: Secondary | ICD-10-CM | POA: Diagnosis not present

## 2017-06-27 DIAGNOSIS — R1084 Generalized abdominal pain: Secondary | ICD-10-CM | POA: Diagnosis not present

## 2017-06-27 DIAGNOSIS — R102 Pelvic and perineal pain: Secondary | ICD-10-CM | POA: Diagnosis not present

## 2017-07-02 DIAGNOSIS — M62838 Other muscle spasm: Secondary | ICD-10-CM | POA: Diagnosis not present

## 2017-07-02 DIAGNOSIS — R102 Pelvic and perineal pain: Secondary | ICD-10-CM | POA: Diagnosis not present

## 2017-07-02 DIAGNOSIS — R1084 Generalized abdominal pain: Secondary | ICD-10-CM | POA: Diagnosis not present

## 2017-07-02 DIAGNOSIS — M6281 Muscle weakness (generalized): Secondary | ICD-10-CM | POA: Diagnosis not present

## 2017-07-03 DIAGNOSIS — R3121 Asymptomatic microscopic hematuria: Secondary | ICD-10-CM | POA: Diagnosis not present

## 2017-07-03 DIAGNOSIS — R310 Gross hematuria: Secondary | ICD-10-CM | POA: Diagnosis not present

## 2017-07-09 DIAGNOSIS — R1084 Generalized abdominal pain: Secondary | ICD-10-CM | POA: Diagnosis not present

## 2017-07-09 DIAGNOSIS — M62838 Other muscle spasm: Secondary | ICD-10-CM | POA: Diagnosis not present

## 2017-07-09 DIAGNOSIS — M6281 Muscle weakness (generalized): Secondary | ICD-10-CM | POA: Diagnosis not present

## 2017-07-09 DIAGNOSIS — R102 Pelvic and perineal pain: Secondary | ICD-10-CM | POA: Diagnosis not present

## 2017-08-14 DIAGNOSIS — R102 Pelvic and perineal pain: Secondary | ICD-10-CM | POA: Diagnosis not present

## 2017-08-14 DIAGNOSIS — M62838 Other muscle spasm: Secondary | ICD-10-CM | POA: Diagnosis not present

## 2017-08-14 DIAGNOSIS — M6289 Other specified disorders of muscle: Secondary | ICD-10-CM | POA: Diagnosis not present

## 2017-08-14 DIAGNOSIS — M6281 Muscle weakness (generalized): Secondary | ICD-10-CM | POA: Diagnosis not present

## 2018-01-09 HISTORY — PX: ABDOMINOPLASTY: SUR9

## 2018-03-26 DIAGNOSIS — Z6825 Body mass index (BMI) 25.0-25.9, adult: Secondary | ICD-10-CM | POA: Diagnosis not present

## 2018-03-26 DIAGNOSIS — E559 Vitamin D deficiency, unspecified: Secondary | ICD-10-CM | POA: Diagnosis not present

## 2018-03-26 DIAGNOSIS — R350 Frequency of micturition: Secondary | ICD-10-CM | POA: Diagnosis not present

## 2018-03-26 DIAGNOSIS — I10 Essential (primary) hypertension: Secondary | ICD-10-CM | POA: Diagnosis not present

## 2018-03-26 DIAGNOSIS — N3281 Overactive bladder: Secondary | ICD-10-CM | POA: Diagnosis not present

## 2018-03-26 DIAGNOSIS — Z0000 Encounter for general adult medical examination without abnormal findings: Secondary | ICD-10-CM | POA: Diagnosis not present

## 2018-03-26 DIAGNOSIS — M12841 Other specific arthropathies, not elsewhere classified, right hand: Secondary | ICD-10-CM | POA: Diagnosis not present

## 2018-05-14 DIAGNOSIS — Z0000 Encounter for general adult medical examination without abnormal findings: Secondary | ICD-10-CM | POA: Diagnosis not present

## 2018-05-21 DIAGNOSIS — E785 Hyperlipidemia, unspecified: Secondary | ICD-10-CM | POA: Diagnosis not present

## 2018-05-21 DIAGNOSIS — Z0000 Encounter for general adult medical examination without abnormal findings: Secondary | ICD-10-CM | POA: Diagnosis not present

## 2018-05-21 DIAGNOSIS — J302 Other seasonal allergic rhinitis: Secondary | ICD-10-CM | POA: Diagnosis not present

## 2018-05-21 DIAGNOSIS — I10 Essential (primary) hypertension: Secondary | ICD-10-CM | POA: Diagnosis not present

## 2018-05-21 DIAGNOSIS — Z1331 Encounter for screening for depression: Secondary | ICD-10-CM | POA: Diagnosis not present

## 2018-05-24 DIAGNOSIS — Z20828 Contact with and (suspected) exposure to other viral communicable diseases: Secondary | ICD-10-CM | POA: Diagnosis not present

## 2018-06-11 DIAGNOSIS — R5383 Other fatigue: Secondary | ICD-10-CM | POA: Diagnosis not present

## 2018-06-11 DIAGNOSIS — J302 Other seasonal allergic rhinitis: Secondary | ICD-10-CM | POA: Diagnosis not present

## 2018-06-11 DIAGNOSIS — I10 Essential (primary) hypertension: Secondary | ICD-10-CM | POA: Diagnosis not present

## 2018-06-11 DIAGNOSIS — R7301 Impaired fasting glucose: Secondary | ICD-10-CM | POA: Diagnosis not present

## 2018-07-08 DIAGNOSIS — Z6827 Body mass index (BMI) 27.0-27.9, adult: Secondary | ICD-10-CM | POA: Diagnosis not present

## 2018-07-08 DIAGNOSIS — Z01419 Encounter for gynecological examination (general) (routine) without abnormal findings: Secondary | ICD-10-CM | POA: Diagnosis not present

## 2018-07-08 DIAGNOSIS — Z1151 Encounter for screening for human papillomavirus (HPV): Secondary | ICD-10-CM | POA: Diagnosis not present

## 2018-07-08 DIAGNOSIS — Z1231 Encounter for screening mammogram for malignant neoplasm of breast: Secondary | ICD-10-CM | POA: Diagnosis not present

## 2018-07-17 ENCOUNTER — Other Ambulatory Visit: Payer: Self-pay | Admitting: *Deleted

## 2018-07-17 DIAGNOSIS — Z20822 Contact with and (suspected) exposure to covid-19: Secondary | ICD-10-CM

## 2018-07-17 DIAGNOSIS — R6889 Other general symptoms and signs: Secondary | ICD-10-CM | POA: Diagnosis not present

## 2018-07-22 LAB — NOVEL CORONAVIRUS, NAA: SARS-CoV-2, NAA: NOT DETECTED

## 2018-08-20 DIAGNOSIS — R7301 Impaired fasting glucose: Secondary | ICD-10-CM | POA: Diagnosis not present

## 2018-08-20 DIAGNOSIS — I10 Essential (primary) hypertension: Secondary | ICD-10-CM | POA: Diagnosis not present

## 2018-08-20 DIAGNOSIS — Z20828 Contact with and (suspected) exposure to other viral communicable diseases: Secondary | ICD-10-CM | POA: Diagnosis not present

## 2018-08-20 DIAGNOSIS — Z01818 Encounter for other preprocedural examination: Secondary | ICD-10-CM | POA: Diagnosis not present

## 2018-08-27 DIAGNOSIS — Z01818 Encounter for other preprocedural examination: Secondary | ICD-10-CM | POA: Diagnosis not present

## 2018-09-03 DIAGNOSIS — Z01818 Encounter for other preprocedural examination: Secondary | ICD-10-CM | POA: Diagnosis not present

## 2018-09-03 DIAGNOSIS — Z20828 Contact with and (suspected) exposure to other viral communicable diseases: Secondary | ICD-10-CM | POA: Diagnosis not present

## 2018-09-04 DIAGNOSIS — Z79899 Other long term (current) drug therapy: Secondary | ICD-10-CM | POA: Diagnosis not present

## 2018-09-04 DIAGNOSIS — Z01818 Encounter for other preprocedural examination: Secondary | ICD-10-CM | POA: Diagnosis not present

## 2018-09-07 DIAGNOSIS — M25562 Pain in left knee: Secondary | ICD-10-CM | POA: Diagnosis not present

## 2018-09-07 DIAGNOSIS — I10 Essential (primary) hypertension: Secondary | ICD-10-CM | POA: Diagnosis not present

## 2018-09-07 DIAGNOSIS — M545 Low back pain: Secondary | ICD-10-CM | POA: Diagnosis not present

## 2018-10-01 ENCOUNTER — Encounter: Payer: Self-pay | Admitting: Gynecology

## 2018-11-12 DIAGNOSIS — Z20828 Contact with and (suspected) exposure to other viral communicable diseases: Secondary | ICD-10-CM | POA: Diagnosis not present

## 2018-12-11 DIAGNOSIS — Z20828 Contact with and (suspected) exposure to other viral communicable diseases: Secondary | ICD-10-CM | POA: Diagnosis not present

## 2019-01-30 DIAGNOSIS — Z20828 Contact with and (suspected) exposure to other viral communicable diseases: Secondary | ICD-10-CM | POA: Diagnosis not present

## 2019-03-05 DIAGNOSIS — Z20828 Contact with and (suspected) exposure to other viral communicable diseases: Secondary | ICD-10-CM | POA: Diagnosis not present

## 2019-03-05 DIAGNOSIS — Z20822 Contact with and (suspected) exposure to covid-19: Secondary | ICD-10-CM | POA: Diagnosis not present

## 2019-03-13 ENCOUNTER — Ambulatory Visit: Payer: Self-pay | Attending: Family

## 2019-03-13 DIAGNOSIS — Z23 Encounter for immunization: Secondary | ICD-10-CM | POA: Insufficient documentation

## 2019-03-13 NOTE — Progress Notes (Signed)
   Covid-19 Vaccination Clinic  Name:  Rebekah [name]    MRN: [id number] DOB: 1970-10-27  03/13/2019  Rebekah [name] was observed post Covid-19 immunization for 15 minutes without incident. She was provided with Vaccine Information Sheet and instruction to access the V-Safe system.   Rebekah [name] was instructed to call 911 with any severe reactions post vaccine: Marland Kitchen Difficulty breathing  . Swelling of face and throat  . A fast heartbeat  . A bad rash all over body  . Dizziness and weakness   Immunizations Administered    Name Date Dose VIS Date Route   Moderna COVID-19 Vaccine 03/13/2019 12:13 PM 0.5 mL 12/10/2018 Intramuscular   Manufacturer: Moderna   Lot: [id number]   [id number]

## 2019-04-15 ENCOUNTER — Ambulatory Visit: Payer: Self-pay | Attending: Family

## 2019-04-15 DIAGNOSIS — Z23 Encounter for immunization: Secondary | ICD-10-CM

## 2019-04-15 NOTE — Progress Notes (Signed)
   Covid-19 Vaccination Clinic  Name:  Rebekah [name]    MRN: [id number] DOB: 11-Sep-1970  04/15/2019  [name] was observed post Covid-19 immunization for 15 minutes without incident. She was provided with Vaccine Information Sheet and instruction to access the V-Safe system.   [name] was instructed to call 911 with any severe reactions post vaccine: Marland Kitchen Difficulty breathing  . Swelling of face and throat  . A fast heartbeat  . A bad rash all over body  . Dizziness and weakness   Immunizations Administered    Name Date Dose VIS Date Route   Moderna COVID-19 Vaccine 04/15/2019 12:43 PM 0.5 mL 12/10/2018 Intramuscular   Manufacturer: Moderna   Lot: [id number]   [id number]:[id number]

## 2019-05-21 DIAGNOSIS — E7849 Other hyperlipidemia: Secondary | ICD-10-CM | POA: Diagnosis not present

## 2019-05-21 DIAGNOSIS — I10 Essential (primary) hypertension: Secondary | ICD-10-CM | POA: Diagnosis not present

## 2019-05-21 DIAGNOSIS — Z0000 Encounter for general adult medical examination without abnormal findings: Secondary | ICD-10-CM | POA: Diagnosis not present

## 2019-05-26 DIAGNOSIS — I10 Essential (primary) hypertension: Secondary | ICD-10-CM | POA: Diagnosis not present

## 2019-05-26 DIAGNOSIS — E785 Hyperlipidemia, unspecified: Secondary | ICD-10-CM | POA: Diagnosis not present

## 2019-05-26 DIAGNOSIS — M549 Dorsalgia, unspecified: Secondary | ICD-10-CM | POA: Diagnosis not present

## 2019-05-26 DIAGNOSIS — Z1331 Encounter for screening for depression: Secondary | ICD-10-CM | POA: Diagnosis not present

## 2019-05-26 DIAGNOSIS — Z0000 Encounter for general adult medical examination without abnormal findings: Secondary | ICD-10-CM | POA: Diagnosis not present

## 2019-06-27 DIAGNOSIS — M898X3 Other specified disorders of bone, forearm: Secondary | ICD-10-CM | POA: Diagnosis not present

## 2019-06-27 DIAGNOSIS — M25531 Pain in right wrist: Secondary | ICD-10-CM | POA: Diagnosis not present

## 2019-06-27 DIAGNOSIS — M7989 Other specified soft tissue disorders: Secondary | ICD-10-CM | POA: Diagnosis not present

## 2019-06-27 DIAGNOSIS — M79641 Pain in right hand: Secondary | ICD-10-CM | POA: Diagnosis not present

## 2019-07-22 DIAGNOSIS — Z1151 Encounter for screening for human papillomavirus (HPV): Secondary | ICD-10-CM | POA: Diagnosis not present

## 2019-07-22 DIAGNOSIS — R32 Unspecified urinary incontinence: Secondary | ICD-10-CM | POA: Diagnosis not present

## 2019-07-22 DIAGNOSIS — Z01419 Encounter for gynecological examination (general) (routine) without abnormal findings: Secondary | ICD-10-CM | POA: Diagnosis not present

## 2019-07-22 DIAGNOSIS — Z124 Encounter for screening for malignant neoplasm of cervix: Secondary | ICD-10-CM | POA: Diagnosis not present

## 2019-07-22 DIAGNOSIS — Z6828 Body mass index (BMI) 28.0-28.9, adult: Secondary | ICD-10-CM | POA: Diagnosis not present

## 2019-07-22 DIAGNOSIS — Z1231 Encounter for screening mammogram for malignant neoplasm of breast: Secondary | ICD-10-CM | POA: Diagnosis not present

## 2019-07-30 DIAGNOSIS — R32 Unspecified urinary incontinence: Secondary | ICD-10-CM | POA: Diagnosis not present

## 2019-10-06 DIAGNOSIS — Z1152 Encounter for screening for COVID-19: Secondary | ICD-10-CM | POA: Diagnosis not present

## 2020-06-08 ENCOUNTER — Other Ambulatory Visit: Payer: Self-pay | Admitting: Internal Medicine

## 2020-06-08 DIAGNOSIS — I890 Lymphedema, not elsewhere classified: Secondary | ICD-10-CM

## 2020-06-16 ENCOUNTER — Ambulatory Visit
Admission: RE | Admit: 2020-06-16 | Discharge: 2020-06-16 | Disposition: A | Discharge status: Home / Self Care | Payer: Managed Care, Other (non HMO) | Source: Ambulatory Visit | Attending: Internal Medicine | Admitting: Internal Medicine

## 2020-06-16 DIAGNOSIS — I890 Lymphedema, not elsewhere classified: Secondary | ICD-10-CM

## 2020-06-16 MED ORDER — IOPAMIDOL (ISOVUE-300) INJECTION 61%
75.0000 mL | Freq: Once | INTRAVENOUS | Status: AC | PRN
  Administered 2020-06-16: 75 mL via INTRAVENOUS

## 2020-08-26 ENCOUNTER — Emergency Department (HOSPITAL_BASED_OUTPATIENT_CLINIC_OR_DEPARTMENT_OTHER): Payer: Managed Care, Other (non HMO)

## 2020-08-26 ENCOUNTER — Encounter (HOSPITAL_BASED_OUTPATIENT_CLINIC_OR_DEPARTMENT_OTHER): Payer: Self-pay

## 2020-08-26 ENCOUNTER — Emergency Department (HOSPITAL_BASED_OUTPATIENT_CLINIC_OR_DEPARTMENT_OTHER)
Admission: EM | Admit: 2020-08-26 | Discharge: 2020-08-26 | Disposition: A | Discharge status: Home / Self Care | Payer: Managed Care, Other (non HMO) | Attending: Emergency Medicine | Admitting: Emergency Medicine

## 2020-08-26 ENCOUNTER — Other Ambulatory Visit: Payer: Self-pay

## 2020-08-26 DIAGNOSIS — Z79899 Other long term (current) drug therapy: Secondary | ICD-10-CM | POA: Insufficient documentation

## 2020-08-26 DIAGNOSIS — F17210 Nicotine dependence, cigarettes, uncomplicated: Secondary | ICD-10-CM | POA: Diagnosis not present

## 2020-08-26 DIAGNOSIS — I10 Essential (primary) hypertension: Secondary | ICD-10-CM | POA: Diagnosis not present

## 2020-08-26 DIAGNOSIS — Z91040 Latex allergy status: Secondary | ICD-10-CM | POA: Insufficient documentation

## 2020-08-26 DIAGNOSIS — R519 Headache, unspecified: Secondary | ICD-10-CM | POA: Diagnosis present

## 2020-08-26 DIAGNOSIS — R112 Nausea with vomiting, unspecified: Secondary | ICD-10-CM | POA: Diagnosis not present

## 2020-08-26 LAB — CBC WITH DIFFERENTIAL/PLATELET
Abs Immature Granulocytes: 0.03 10*3/uL (ref 0.00–0.07)
Basophils Absolute: 0 10*3/uL (ref 0.0–0.1)
Basophils Relative: 0 %
Eosinophils Absolute: 0.1 10*3/uL (ref 0.0–0.5)
Eosinophils Relative: 1 %
HCT: 39 % (ref 36.0–46.0)
Hemoglobin: 12.6 g/dL (ref 12.0–15.0)
Immature Granulocytes: 0 %
Lymphocytes Relative: 25 %
Lymphs Abs: 2.2 10*3/uL (ref 0.7–4.0)
MCH: 26.8 pg (ref 26.0–34.0)
MCHC: 32.3 g/dL (ref 30.0–36.0)
MCV: 82.8 fL (ref 80.0–100.0)
Monocytes Absolute: 0.6 10*3/uL (ref 0.1–1.0)
Monocytes Relative: 8 %
Neutro Abs: 5.6 10*3/uL (ref 1.7–7.7)
Neutrophils Relative %: 66 %
Platelets: 224 10*3/uL (ref 150–400)
RBC: 4.71 MIL/uL (ref 3.87–5.11)
RDW: 14.5 % (ref 11.5–15.5)
WBC: 8.5 10*3/uL (ref 4.0–10.5)
nRBC: 0 % (ref 0.0–0.2)

## 2020-08-26 LAB — COMPREHENSIVE METABOLIC PANEL
ALT: 13 U/L (ref 0–44)
AST: 23 U/L (ref 15–41)
Albumin: 3.8 g/dL (ref 3.5–5.0)
Alkaline Phosphatase: 71 U/L (ref 38–126)
Anion gap: 13 (ref 5–15)
BUN: 14 mg/dL (ref 6–20)
CO2: 20 mmol/L — ABNORMAL LOW (ref 22–32)
Calcium: 9.2 mg/dL (ref 8.9–10.3)
Chloride: 106 mmol/L (ref 98–111)
Creatinine, Ser: 0.84 mg/dL (ref 0.44–1.00)
GFR, Estimated: >60 mL/min (ref >60)
Glucose, Bld: 108 mg/dL — ABNORMAL HIGH (ref 70–99)
Potassium: 3.6 mmol/L (ref 3.5–5.1)
Sodium: 139 mmol/L (ref 135–145)
Total Bilirubin: 0.4 mg/dL (ref 0.3–1.2)
Total Protein: 7.1 g/dL (ref 6.5–8.1)

## 2020-08-26 MED ORDER — ACETAMINOPHEN 325 MG PO TABS
650.0000 mg | ORAL_TABLET | Freq: Once | ORAL | Status: AC
  Administered 2020-08-26: 650 mg via ORAL

## 2020-08-26 MED ORDER — METOCLOPRAMIDE HCL 5 MG/ML IJ SOLN
10.0000 mg | Freq: Once | INTRAMUSCULAR | Status: AC
  Administered 2020-08-26: 10 mg via INTRAVENOUS
  Filled 2020-08-26: qty 2

## 2020-08-26 NOTE — ED Provider Notes (Signed)
[place] [facility] EMERGENCY DEPT Provider Note   CSN: [id number] Arrival date & time: 08/26/20  1845     History Chief Complaint  Patient presents with   Headache    Htn, nosebleeds    Rebekah [name] is a 50 y.o. female.  She has had a frontal headache that has been increasing in severity.  She has been seeing her OB/GYN, and her diastolic blood pressures have been greater than 100 for several days.  She has increased her metoprolol to 50 mg twice daily, and this seems to be helping.  She did have some nausea and 1 episode of vomiting today, and she had a nosebleed yesterday.  She has tested negative for COVID-19.  She denies any sick contacts.  She denies frequent headaches, and this pain is a new symptoms.  The history is provided by the patient.  Headache Pain location:  Frontal Quality: throbbing. Radiates to:  Does not radiate Pain severity now: severe. Pain scale at highest: severe. Onset quality:  Gradual Duration:  1 day Timing:  Constant Progression:  Worsening Chronicity:  New Context comment:  Unknown cause but has had a recent increase in her BP Relieved by:  Nothing Worsened by:  Nothing Ineffective treatments: increasing dose of metoprolol. Associated symptoms: nausea and vomiting (once)   Associated symptoms: no abdominal pain, no back pain, no blurred vision, no cough, no ear pain, no eye pain, no fever, no focal weakness, no numbness, no paresthesias, no seizures, no sore throat and no visual change       Past Medical History:  Diagnosis Date   Anxiety    Hypertension     Patient Active Problem List   Diagnosis Date Noted   Cavernous hemangioma of liver 10/25/2015   SIRS (systemic inflammatory response syndrome) ([employer])    CAP (community acquired pneumonia) 10/23/2015   Hypertension 10/23/2015   Anxiety 10/23/2015   Hypokalemia 10/23/2015   Abnormal EKG 10/23/2015   Nausea and vomiting 10/23/2015   Sinus infection 07/25/2011   Tooth  pain 07/25/2011   ANEMIA-IRON DEFICIENCY 04/19/2009   ALLERGIC RHINITIS 04/19/2009   GERD 04/19/2009   HOT FLASHES 04/19/2009   WRIST PAIN, RIGHT, CHRONIC 04/19/2009   KNEE PAIN, BILATERAL 04/19/2009   BACK PAIN 04/19/2009    Past Surgical History:  Procedure Laterality Date   WISDOM TOOTH EXTRACTION       OB History   No obstetric history on file.     Family History  Problem Relation Age of Onset   Hypertension Mother    Hypertension Father    Diabetes Mellitus II Maternal Grandfather     Social History   Tobacco Use   Smoking status: Every Day    Packs/day: 0.30    Years: 20.00    Pack years: 6.00    Types: Cigarettes    Last attempt to quit: 03/10/2014    Years since quitting: 6.4   Smokeless tobacco: Never   Tobacco comments:    notes sometimes smokes one to two cigarettes daily  Vaping Use   Vaping Use: Never used  Substance Use Topics   Alcohol use: Yes    Comment: wine   Drug use: No    Home Medications Prior to Admission medications   Medication Sig Start Date End Date Taking? Authorizing Provider  amLODipine (NORVASC) 5 MG tablet Take 5 mg by mouth at bedtime.     [provider]  [employer] VITAMIN D 2000 units CAPS Take 2,000 Units by mouth daily. 09/22/15  [provider]  diazepam (VALIUM) 5 MG tablet Take 5 mg by mouth 2 (two) times daily as needed for anxiety.  10/22/15   [provider]  diclofenac (VOLTAREN) 75 MG EC tablet Take 75 mg by mouth 2 (two) times daily as needed (knee pain).     [provider]  Diclofenac Sodium 3 % GEL Apply 1 application topically 2 (two) times daily as needed (knee pain).  07/20/15   [provider]  HYDROcodone-homatropine (HYCODAN) 5-1.5 MG/5ML syrup Take 5 mLs by mouth every 6 (six) hours as needed for cough. Patient not taking: Reported on 07/24/2016 10/23/15   Clayton Bibles, PA-C  ibuprofen (ADVIL,MOTRIN) 200 MG tablet Take 200-400 mg by mouth every 6 (six) hours as needed  for headache (pain).     [provider]  Ipratropium-Albuterol (COMBIVENT RESPIMAT) 20-100 MCG/ACT AERS respimat Inhale 1 puff into the lungs every 6 (six) hours as needed for wheezing or shortness of breath (or coughing). Patient not taking: Reported on [id number]   Delora Fuel, MD  levofloxacin (LEVAQUIN) 750 MG tablet Take 1 tablet (750 mg total) by mouth daily. Patient not taking: Reported on 07/24/2016 10/23/15   Clayton Bibles, PA-C  loratadine (CLARITIN) 10 MG tablet Take 10 mg by mouth daily as needed for allergies.  10/08/15   [provider]  metoprolol tartrate (LOPRESSOR) 25 MG tablet Take 25 mg by mouth 2 (two) times daily.    [provider]  ondansetron (ZOFRAN) 4 MG tablet Take 1 tablet (4 mg total) by mouth every 6 (six) hours as needed for nausea or vomiting. Patient not taking: Reported on 07/24/2016 10/25/15   Dhungel, Flonnie Overman, MD  promethazine (PHENERGAN) 25 MG tablet Take 1 tablet (25 mg total) by mouth every 6 (six) hours as needed for nausea. Patient not taking: Reported on 07/24/2016 10/23/15   Clayton Bibles, PA-C  traMADol (ULTRAM) 50 MG tablet Take 100 mg by mouth every 6 (six) hours as needed (for pain).    [provider]    Allergies    Latex  Review of Systems   Review of Systems  Constitutional:  Negative for chills and fever.  HENT:  Positive for nosebleeds (yesterday). Negative for ear pain and sore throat.   Eyes:  Negative for blurred vision, pain and visual disturbance.  Respiratory:  Negative for cough and shortness of breath.   Cardiovascular:  Negative for chest pain and palpitations.  Gastrointestinal:  Positive for nausea and vomiting (once). Negative for abdominal pain.  Genitourinary:  Negative for dysuria and hematuria.  Musculoskeletal:  Negative for arthralgias and back pain.  Skin:  Negative for color change and rash.  Neurological:  Positive for headaches. Negative for focal weakness, seizures, syncope,  numbness and paresthesias.  All other systems reviewed and are negative.  Physical Exam Updated Vital Signs BP (!) 151/95 (BP Location: Right Arm)   Pulse 73   Temp 98.5 F (36.9 C) (Oral)   Resp 20   Ht '5\' 7"'$  (1.702 m)   Wt 85.3 kg   LMP 06/25/2011   SpO2 99%   BMI 29.44 kg/m   Physical Exam Vitals and nursing note reviewed.  Constitutional:      Appearance: She is well-developed.  HENT:     Head: Normocephalic and atraumatic.  Cardiovascular:     Rate and Rhythm: Normal rate and regular rhythm.     Heart sounds: Normal heart sounds.  Pulmonary:     Effort: Pulmonary effort is normal. No  tachypnea.     Breath sounds: Normal breath sounds.  Abdominal:     Palpations: Abdomen is soft.     Tenderness: There is no abdominal tenderness.  Musculoskeletal:     Right lower leg: No edema.     Left lower leg: No edema.  Skin:    General: Skin is warm and dry.  Neurological:     General: No focal deficit present.     Mental Status: She is alert and oriented to person, place, and time. Mental status is at baseline.     Cranial Nerves: No dysarthria.  Psychiatric:        Mood and Affect: Mood normal.        Behavior: Behavior normal.    ED Results / Procedures / Treatments   Labs (all labs ordered are listed, but only abnormal results are displayed) Labs Reviewed  COMPREHENSIVE METABOLIC PANEL - Abnormal; Notable for the following components:      Result Value   CO2 20 (*)    Glucose, Bld 108 (*)    All other components within normal limits  CBC WITH DIFFERENTIAL/PLATELET    EKG EKG Interpretation  Date/Time:  Thursday August 26 2020 18:58:14 EDT Ventricular Rate:  79 PR Interval:  184 QRS Duration: 93 QT Interval:  379 QTC Calculation: 435 R Axis:   59 Text Interpretation: Sinus rhythm RSR' in V1 or V2, right VCD or RVH similar to prior No acute ischemia Confirmed by Lorre Munroe ([id number]) on 08/26/2020 7:20:59 PM  Radiology CT Head Wo Contrast  Result Date:  08/26/2020 CLINICAL DATA:  Headache EXAM: CT HEAD WITHOUT CONTRAST TECHNIQUE: Contiguous axial images were obtained from the base of the skull through the vertex without intravenous contrast. COMPARISON:  None. FINDINGS: Brain: No evidence of acute infarction, hemorrhage, cerebral edema, mass, mass effect, or midline shift. Ventricles and sulci are normal for age. No extra-axial fluid collection. Vascular: No hyperdense vessel or unexpected calcification. Skull: Normal. Negative for fracture or focal lesion. Sinuses/Orbits: No acute finding. Other: The mastoid air cells are well aerated. IMPRESSION: No acute intracranial process. No etiology seen for the patient's headache. Electronically Signed   By: Merilyn Baba M.D.   On: 08/26/2020 20:16    Procedures Procedures   Medications Ordered in ED Medications  metoCLOPramide (REGLAN) injection 10 mg (has no administration in time range)    ED Course  I have reviewed the triage vital signs and the nursing notes.  Pertinent labs & imaging results that were available during my care of the patient were reviewed by me and considered in my medical decision making (see chart for details).    MDM Rules/Calculators/A&P                           Rebekah [name] presents with headache and hypertension.  She was evaluated for evidence of hypertensive emergency.  Headache was treated as a migraine secondary to her photophobia.  She was much improved at discharge, and ED work-up was essentially normal.  She does admit to a great deal of stress, and this could certainly be causing her symptoms.  She was instructed to follow-up with her primary care provider.  Return precautions were given. Final Clinical Impression(s) / ED Diagnoses Final diagnoses:  Acute nonintractable headache, unspecified headache type  Primary hypertension    Rx / DC Orders ED Discharge Orders     None        Arnaldo Natal, MD  08/26/20 2119  

## 2020-08-26 NOTE — ED Triage Notes (Addendum)
Presents with HTN headache and nosebleeds. Also complains of nausea and vomiting.

## 2021-02-16 ENCOUNTER — Ambulatory Visit (HOSPITAL_BASED_OUTPATIENT_CLINIC_OR_DEPARTMENT_OTHER): Payer: Managed Care, Other (non HMO) | Admitting: Cardiovascular Disease

## 2021-05-16 NOTE — Progress Notes (Signed)
?Cardiology Office Note:   ? ?Date:  05/17/2021  ? ?ID:  Rebekah [name], DOB [birth year], MRN [id number] ? ?PCP:  [name], FNP ?  ?[facility] Providers ?Cardiologist:  None    ? ?Referring MD: [name], FNP  ? ?No chief complaint on file. ? ? ?History of Present Illness:   ? ?Rebekah [name] is a 51 y.o. female with a hx of hypertension, and anxiety, here for the evaluation of hypertension. Previously seen in the ED 08/2020 for a worsening frontal headache treated as a migraine secondary to her photophobia. She noted her diastolic blood pressures had been greater than 100 for several days. She had increased her metoprolol to 50 mg twice daily which seemed to help. Also she admitted to being under a great deal of stress. One day prior she had epistaxis, and then had nausea/vomiting on the day of her ED visit. Her blood pressure in the ED was 151/95. Her EKG at that time showed sinus rhythm RSR' in V1 or V2, right VCD or RVH similar to prior, no acute ischemia. Work-up was reassuring and she was instructed to follow-up with her PCP. She had a coronary CTA in 2013 with a calcium score of 0 and no CAD. It was suggestive of GERD. ? ?She is accompanied by her husband. Today, her main concern is her ongoing high blood pressures and headaches. She reports being on antihypertensives for about 5 years. More recently her blood pressure has become difficult to control, which she mostly attributes to high amounts of stress and chronic knee pain. Usually her pressures are high at home; she notes that today's in clinic reading of 138/88 is good for her. Currently, she is not taking amlodipine due to developing fast heart rates and epistaxis. She was switched to HCTZ. She successfully quit smoking about 3 months ago. She notes that she could always quit, but she would smoke due to stress. Since quitting her blood pressure has lowered slightly. Lately she complains of bilateral knee pain but has not noticed any  edema. She may take ibuprofen for pain management infrequently. 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 The more she stands during the day, the more her knee pain is exacerbated and it becomes difficult to ambulate later in the day such as climbing stairs. For exercise she follows a routine including 15 min of meditation, pilates or yoga, then a more cardio intensive activity. Regarding her diet she will cook meals at home and order out. She usually has fresh, frozen, or organic foods and is conscientious of her salt intake. She will drink coffee up to 3-4 cups a week, once a day at most. Her alcohol consumption is limited to rare social occasions. She endorses snoring, which her husband states is lighter than it used to be. In the past 1-2 weeks she has been feeling more fatigued, but she denies falling asleep easily during the day. She denies any palpitations, chest pain, shortness of breath, or peripheral edema. No lightheadedness, syncope, orthopnea, or PND. ? ? ?Past Medical History:  ?Diagnosis Date  ? Anxiety   ? Hypertension   ? ? ?Past Surgical History:  ?Procedure Laterality Date  ? WISDOM TOOTH EXTRACTION    ? ? ?Current Medications: ?Current Meds  ?Medication Sig  ? [employer]S VITAMIN D 2000 units CAPS Take 2,000 Units by mouth daily.  ? diazepam (VALIUM) 5 MG tablet Take 5  mg by mouth 2 (two) times daily as needed for anxiety.   ? hydrochlorothiazide (MICROZIDE) 12.5 MG capsule Take 12.5 mg by mouth daily.  ? ibuprofen (ADVIL,MOTRIN) 200 MG tablet Take 200-400 mg by mouth every 6 (six) hours as needed for headache (pain).   ? Ipratropium-Albuterol (COMBIVENT RESPIMAT) 20-100 MCG/ACT AERS respimat Inhale 1 puff into the lungs every 6 (six) hours as needed for wheezing or shortness of breath (or coughing).  ? loratadine (CLARITIN) 10 MG tablet Take 10 mg by mouth daily as needed for allergies.   ? metoprolol tartrate (LOPRESSOR) 50  MG tablet Take 50 mg by mouth 2 (two) times daily.  ? [DISCONTINUED] amLODipine (NORVASC) 5 MG tablet Take 5 mg by mouth 2 (two) times daily.  ? [DISCONTINUED] metoprolol tartrate (LOPRESSOR) 25 MG tablet Take 25 mg by mouth 2 (two) times daily.  ?  ? ?Allergies:   Latex  ? ?Social History  ? ?Socioeconomic History  ? Marital status: Married  ?  Spouse name: Not on file  ? Number of children: Not on file  ? Years of education: Not on file  ? Highest education level: Not on file  ?Occupational History  ? Not on file  ?Tobacco Use  ? Smoking status: Every Day  ?  Packs/day: 0.30  ?  Years: 20.00  ?  Pack years: 6.00  ?  Types: Cigarettes  ?  Last attempt to quit: 03/10/2014  ?  Years since quitting: 7.1  ? Smokeless tobacco: Never  ? Tobacco comments:  ?  notes sometimes smokes one to two cigarettes daily  ?Vaping Use  ? Vaping Use: Never used  ?Substance and Sexual Activity  ? Alcohol use: Yes  ?  Comment: wine  ? Drug use: No  ? Sexual activity: Yes  ?  Partners: Male  ?Other Topics Concern  ? Not on file  ?Social History Narrative  ? Not on file  ? ?Social Determinants of Health  ? ?Financial Resource Strain: Low Risk   ? Difficulty of Paying Living Expenses: Not hard at all  ?Food Insecurity: No Food Insecurity  ? Worried About Charity fundraiser in the Last Year: Never true  ? Ran Out of Food in the Last Year: Never true  ?Transportation Needs: No Transportation Needs  ? Lack of Transportation (Medical): No  ? Lack of Transportation (Non-Medical): No  ?Physical Activity: Insufficiently Active  ? Days of Exercise per Week: 2 days  ? Minutes of Exercise per Session: 40 min  ?Stress: Not on file  ?Social Connections: Not on file  ?  ? ?Family History: ?The patient's family history includes Diabetes Mellitus II in her maternal grandfather; Hypertension in her father and mother. ? ?ROS:   ?Please see the history of present illness.    ?(+) Headaches ?(+) Stress ?(+) Bilateral knee pain ?(+) Fatigue ?(+) Snoring ?All  other systems reviewed and are negative. ? ?EKGs/Labs/Other Studies Reviewed:   ? ?The following studies were reviewed today: ? ?CT Chest 06/16/2020: ?FINDINGS: ?Cardiovascular: Nonaneurysmal aorta. Normal cardiac size. No ?pericardial effusion ?  ?Mediastinum/Nodes: Midline trachea. No thyroid mass. No suspicious ?adenopathy. Esophagus within normal limits. ?  ?Lungs/Pleura: Lungs are clear. No pleural effusion or pneumothorax. ?  ?Upper Abdomen: Peripherally enhancing mass in the right hepatic lobe ?measuring 17 mm consistent with hemangioma. No acute abnormality. ?  ?Musculoskeletal: No chest wall abnormality. No acute or significant ?osseous findings. ?  ?IMPRESSION: ?1. Negative CT examination of the chest with contrast. ?2. 17  mm right hepatic lobe hemangioma ? ?Coronary CTA 12/26/2011: ?FINDINGS:  ?Technical quality: Good.  ?Heart rate:  55.  ? ?CORONARY ARTERIES:  ?Left Main:              Moderate length vessel, which arises from  ?the left coronary cusp and is normal.  Gives rise to a ramus  ?branch, which is moderate in size and unremarkable.  ?LAD:              Moderate to large vessel.  Without significant  ?disease.  Tapers distally.  ?Diagonals:              Small patent first diagonal.  Diminutive  ?patent second diagonal.  ?LCx:              Moderate sized, nondominant vessel.  A short  ?vessel, which continues as a branching marginal, which is normal.  ?RCA:              A large, dominant vessel which arises from the  ?right coronary cusp.  Negative.  ?PDA:                    Supplied primarily by a distal right  ?coronary artery branch and patent.  ?Dominance:        Right-sided  ? ?CORONARY CALCIUM:  ?Total Agatston Score:         0  ? ?AORTA AND PULMONARY MEASUREMENTS:  ? ?Aortic root (21 - 40 mm):     22 mm at the annulus  ?28 mm at the sinuses of Valsalva  ? ?                              22 mm at the sinotubular  junction  ?                                ?Ascending aorta:  (< 40 mm):  27 mm   ?Descending aorta:  (< 40 mm): 19 mm  ?Main pulmonary artery: (< 30 mm): 20 mm  ? ?OTHER FINDINGS:  ?Lungs/pleura: No airspace opacities. No pleural fluid.  ? ?Heart/Mediastinum: No imaged thoracic adenop

## 2021-05-17 ENCOUNTER — Ambulatory Visit (HOSPITAL_BASED_OUTPATIENT_CLINIC_OR_DEPARTMENT_OTHER): Payer: Managed Care, Other (non HMO) | Admitting: Cardiovascular Disease

## 2021-05-17 ENCOUNTER — Encounter (HOSPITAL_BASED_OUTPATIENT_CLINIC_OR_DEPARTMENT_OTHER): Payer: Self-pay | Admitting: Cardiovascular Disease

## 2021-05-17 DIAGNOSIS — E876 Hypokalemia: Secondary | ICD-10-CM

## 2021-05-17 DIAGNOSIS — I10 Essential (primary) hypertension: Secondary | ICD-10-CM | POA: Diagnosis not present

## 2021-05-17 MED ORDER — HYDROCHLOROTHIAZIDE 25 MG PO TABS: 25.0000 mg | ORAL_TABLET | Freq: Every day | ORAL | 3 refills | Status: DC

## 2021-05-17 NOTE — Assessment & Plan Note (Signed)
She does have a history of hypokalemia.  Given that we are increasing her HCTZ, we will need to check a BMP in a week.  We may need to add spironolactone and get rid of the metoprolol. ?

## 2021-05-17 NOTE — Assessment & Plan Note (Addendum)
Blood pressure poorly controlled.  We will increase her hydrochlorothiazide to 25 mg.  Check lipids, CMP, TSH in a week.  She was congratulated on smoking cessation.  Encouraged her to limit ibuprofen use and to increase Tylenol instead.  Continue to limit sodium and increase her exercise to at least 150 minutes weekly.  We discussed the fact that metoprolol is probably not the most effective second blood pressure medication.  She will continue it for now.  If her blood pressure remains elevated at follow-up, we will switch to an ARB.  She did not tolerate amlodipine in the past.  ?

## 2021-05-17 NOTE — Patient Instructions (Signed)
Medication Instructions:  ?INCREASE YOUR HYDROCHLOROTHIAZIDE TO 25 MG DAILY  ? ?*If you need a refill on your cardiac medications before your next appointment, please call your pharmacy* ? ?Lab Work: ?FASTING LP/CMET/TSH IN 1 WEEK  ? ?If you have labs (blood work) drawn today and your tests are completely normal, you will receive your results only by: ?MyChart Message (if you have MyChart) OR ?A paper copy in the mail ?If you have any lab test that is abnormal or we need to change your treatment, we will call you to review the results. ? ? ?Testing/Procedures: ?NONE ? ? ?Follow-Up: ?At [facility], you and your health needs are our priority.  As part of our continuing mission to provide you with exceptional heart care, we have created designated Provider Care Teams.  These Care Teams include your primary Cardiologist (physician) and Advanced Practice Providers (APPs -  Physician Assistants and Nurse Practitioners) who all work together to provide you with the care you need, when you need it. ? ?We recommend signing up for the patient portal called "MyChart".  Sign up information is provided on this After Visit Summary.  MyChart is used to connect with patients for Virtual Visits (Telemedicine).  Patients are able to view lab/test results, encounter notes, upcoming appointments, etc.  Non-urgent messages can be sent to your provider as well.   ?To learn more about what you can do with MyChart, go to [email or web address].   ? ?Your next appointment:   ?6 month(s) ? ?The format for your next appointment:   ?In Person ? ?Provider:   ?Skeet Latch, MD  ? ?PHARM D 1 MONTH  ? ?Other Instructions ?MONITOR AND LOG YOUR BLOOD PRESSURE DAILY. BRING YOUR LOG AND BLOOD PRESSURE MACHINE TO FOLLOW UP IN 1 MONTH  ? ?Important Information About Sugar ? ? ? ? ? ?

## 2021-05-22 ENCOUNTER — Encounter (HOSPITAL_BASED_OUTPATIENT_CLINIC_OR_DEPARTMENT_OTHER): Payer: Self-pay | Admitting: Cardiovascular Disease

## 2021-05-24 ENCOUNTER — Telehealth: Payer: Self-pay

## 2021-05-24 NOTE — Telephone Encounter (Signed)
Moved to 9:30 am ?

## 2021-05-24 NOTE — Telephone Encounter (Signed)
Lmom pt that they were scheduled for a 73mn time slot and we would like to get her rescheduled for a full 30 mins and we could get her in same day at [facility] for 9:30am. I will route to myself to follow up on and to dr. ABrion Alimentto make her aware.  ?

## 2021-05-25 LAB — COMPREHENSIVE METABOLIC PANEL
ALT: 18 IU/L (ref 0–32)
AST: 42 IU/L — ABNORMAL HIGH (ref 0–40)
Albumin/Globulin Ratio: 1.4 (ref 1.2–2.2)
Albumin: 4.6 g/dL (ref 3.8–4.8)
Alkaline Phosphatase: 85 IU/L (ref 44–121)
BUN/Creatinine Ratio: 13 (ref 9–23)
BUN: 13 mg/dL (ref 6–24)
Bilirubin Total: 0.4 mg/dL (ref 0.0–1.2)
CO2: 24 mmol/L (ref 20–29)
Calcium: 9.8 mg/dL (ref 8.7–10.2)
Chloride: 98 mmol/L (ref 96–106)
Creatinine, Ser: 1.02 mg/dL — ABNORMAL HIGH (ref 0.57–1.00)
Globulin, Total: 3.4 g/dL (ref 1.5–4.5)
Glucose: 101 mg/dL — ABNORMAL HIGH (ref 70–99)
Potassium: 4.3 mmol/L (ref 3.5–5.2)
Sodium: 141 mmol/L (ref 134–144)
Total Protein: 8 g/dL (ref 6.0–8.5)
eGFR: 67 mL/min/{1.73_m2} (ref >59)

## 2021-05-25 LAB — LIPID PANEL
Chol/HDL Ratio: 2.9 ratio (ref 0.0–4.4)
Cholesterol, Total: 250 mg/dL — ABNORMAL HIGH (ref 100–199)
HDL: 85 mg/dL (ref >39)
LDL Chol Calc (NIH): 141 mg/dL — ABNORMAL HIGH (ref 0–99)
Triglycerides: 137 mg/dL (ref 0–149)
VLDL Cholesterol Cal: 24 mg/dL (ref 5–40)

## 2021-05-25 LAB — TSH: TSH: 1.98 u[IU]/mL (ref 0.450–4.500)

## 2021-05-26 ENCOUNTER — Emergency Department (HOSPITAL_BASED_OUTPATIENT_CLINIC_OR_DEPARTMENT_OTHER): Payer: Managed Care, Other (non HMO)

## 2021-05-26 ENCOUNTER — Emergency Department (HOSPITAL_BASED_OUTPATIENT_CLINIC_OR_DEPARTMENT_OTHER)
Admission: EM | Admit: 2021-05-26 | Discharge: 2021-05-26 | Disposition: A | Discharge status: Home / Self Care | Payer: Managed Care, Other (non HMO) | Attending: Emergency Medicine | Admitting: Emergency Medicine

## 2021-05-26 ENCOUNTER — Other Ambulatory Visit (HOSPITAL_BASED_OUTPATIENT_CLINIC_OR_DEPARTMENT_OTHER): Payer: Self-pay

## 2021-05-26 ENCOUNTER — Encounter (HOSPITAL_BASED_OUTPATIENT_CLINIC_OR_DEPARTMENT_OTHER): Payer: Self-pay | Admitting: Emergency Medicine

## 2021-05-26 ENCOUNTER — Other Ambulatory Visit: Payer: Self-pay

## 2021-05-26 DIAGNOSIS — R0789 Other chest pain: Secondary | ICD-10-CM | POA: Insufficient documentation

## 2021-05-26 DIAGNOSIS — Z91040 Latex allergy status: Secondary | ICD-10-CM | POA: Insufficient documentation

## 2021-05-26 DIAGNOSIS — E876 Hypokalemia: Secondary | ICD-10-CM | POA: Insufficient documentation

## 2021-05-26 DIAGNOSIS — Z79899 Other long term (current) drug therapy: Secondary | ICD-10-CM | POA: Insufficient documentation

## 2021-05-26 DIAGNOSIS — R079 Chest pain, unspecified: Secondary | ICD-10-CM | POA: Diagnosis present

## 2021-05-26 DIAGNOSIS — R0602 Shortness of breath: Secondary | ICD-10-CM | POA: Diagnosis not present

## 2021-05-26 DIAGNOSIS — R1012 Left upper quadrant pain: Secondary | ICD-10-CM | POA: Insufficient documentation

## 2021-05-26 DIAGNOSIS — R1011 Right upper quadrant pain: Secondary | ICD-10-CM | POA: Insufficient documentation

## 2021-05-26 DIAGNOSIS — R1013 Epigastric pain: Secondary | ICD-10-CM | POA: Insufficient documentation

## 2021-05-26 DIAGNOSIS — I10 Essential (primary) hypertension: Secondary | ICD-10-CM | POA: Diagnosis not present

## 2021-05-26 LAB — COMPREHENSIVE METABOLIC PANEL
ALT: 12 U/L (ref 0–44)
AST: 23 U/L (ref 15–41)
Albumin: 4.5 g/dL (ref 3.5–5.0)
Alkaline Phosphatase: 75 U/L (ref 38–126)
Anion gap: 13 (ref 5–15)
BUN: 17 mg/dL (ref 6–20)
CO2: 29 mmol/L (ref 22–32)
Calcium: 9.9 mg/dL (ref 8.9–10.3)
Chloride: 99 mmol/L (ref 98–111)
Creatinine, Ser: 1.03 mg/dL — ABNORMAL HIGH (ref 0.44–1.00)
GFR, Estimated: >60 mL/min (ref >60)
Glucose, Bld: 98 mg/dL (ref 70–99)
Potassium: 3.3 mmol/L — ABNORMAL LOW (ref 3.5–5.1)
Sodium: 141 mmol/L (ref 135–145)
Total Bilirubin: 0.7 mg/dL (ref 0.3–1.2)
Total Protein: 8.2 g/dL — ABNORMAL HIGH (ref 6.5–8.1)

## 2021-05-26 LAB — CBC WITH DIFFERENTIAL/PLATELET
Abs Immature Granulocytes: 0.01 10*3/uL (ref 0.00–0.07)
Basophils Absolute: 0 10*3/uL (ref 0.0–0.1)
Basophils Relative: 0 %
Eosinophils Absolute: 0 10*3/uL (ref 0.0–0.5)
Eosinophils Relative: 1 %
HCT: 41.5 % (ref 36.0–46.0)
Hemoglobin: 13.6 g/dL (ref 12.0–15.0)
Immature Granulocytes: 0 %
Lymphocytes Relative: 37 %
Lymphs Abs: 3 10*3/uL (ref 0.7–4.0)
MCH: 26.4 pg (ref 26.0–34.0)
MCHC: 32.8 g/dL (ref 30.0–36.0)
MCV: 80.4 fL (ref 80.0–100.0)
Monocytes Absolute: 0.6 10*3/uL (ref 0.1–1.0)
Monocytes Relative: 7 %
Neutro Abs: 4.5 10*3/uL (ref 1.7–7.7)
Neutrophils Relative %: 55 %
Platelets: 286 10*3/uL (ref 150–400)
RBC: 5.16 MIL/uL — ABNORMAL HIGH (ref 3.87–5.11)
RDW: 13.7 % (ref 11.5–15.5)
WBC: 8.1 10*3/uL (ref 4.0–10.5)
nRBC: 0 % (ref 0.0–0.2)

## 2021-05-26 LAB — LIPASE, BLOOD: Lipase: 29 U/L (ref 11–51)

## 2021-05-26 LAB — TROPONIN I (HIGH SENSITIVITY): Troponin I (High Sensitivity): 3 ng/L (ref <18)

## 2021-05-26 LAB — D-DIMER, QUANTITATIVE: D-Dimer, Quant: 0.48 ug/mL-FEU (ref 0.00–0.50)

## 2021-05-26 MED ORDER — MORPHINE SULFATE (PF) 4 MG/ML IV SOLN
4.0000 mg | Freq: Once | INTRAVENOUS | Status: AC
  Administered 2021-05-26: 4 mg via INTRAVENOUS
  Filled 2021-05-26: qty 1

## 2021-05-26 MED ORDER — POTASSIUM CHLORIDE CRYS ER 20 MEQ PO TBCR
40.0000 meq | EXTENDED_RELEASE_TABLET | Freq: Once | ORAL | Status: AC
  Administered 2021-05-26: 40 meq via ORAL
  Filled 2021-05-26: qty 2

## 2021-05-26 MED ORDER — POTASSIUM CHLORIDE CRYS ER 20 MEQ PO TBCR
20.0000 meq | EXTENDED_RELEASE_TABLET | Freq: Every day | ORAL | 0 refills | Status: DC
  Filled 2021-05-26: qty 30, 30d supply, fill #0

## 2021-05-26 MED ORDER — ONDANSETRON HCL 4 MG/2ML IJ SOLN
4.0000 mg | Freq: Once | INTRAMUSCULAR | Status: AC
  Administered 2021-05-26: 4 mg via INTRAVENOUS
  Filled 2021-05-26: qty 2

## 2021-05-26 MED ORDER — LIDOCAINE VISCOUS HCL 2 % MT SOLN
15.0000 mL | Freq: Once | OROMUCOSAL | Status: AC
  Administered 2021-05-26: 15 mL via ORAL
  Filled 2021-05-26: qty 15

## 2021-05-26 MED ORDER — ALUM & MAG HYDROXIDE-SIMETH 200-200-20 MG/5ML PO SUSP
30.0000 mL | Freq: Once | ORAL | Status: AC
  Administered 2021-05-26: 30 mL via ORAL
  Filled 2021-05-26: qty 30

## 2021-05-26 MED ORDER — OMEPRAZOLE 20 MG PO CPDR
20.0000 mg | DELAYED_RELEASE_CAPSULE | Freq: Every day | ORAL | 0 refills | Status: AC
  Filled 2021-05-26: qty 14, 14d supply, fill #0

## 2021-05-26 MED ORDER — HYDROCODONE-ACETAMINOPHEN 5-325 MG PO TABS
1.0000 | ORAL_TABLET | Freq: Four times a day (QID) | ORAL | 0 refills | Status: AC | PRN
  Filled 2021-05-26: qty 10, 3d supply, fill #0

## 2021-05-26 MED ORDER — KETOROLAC TROMETHAMINE 15 MG/ML IJ SOLN
15.0000 mg | Freq: Once | INTRAMUSCULAR | Status: AC
  Administered 2021-05-26: 15 mg via INTRAVENOUS
  Filled 2021-05-26: qty 1

## 2021-05-26 NOTE — ED Provider Notes (Signed)
[facility] EMERGENCY DEPT Provider Note   CSN: [id number] Arrival date & time: 05/26/21  0704     History  Chief Complaint  Patient presents with   Chest Pain    Rebekah [name] is a 51 y.o. female.  Patient is a 51 year old female with a history of hypertension, anxiety and prior tobacco use which she quit 4 months ago who is presenting today with complaints of chest pain.  Patient reports that the pain started 5 or 6 days ago but initially is a very mild dull type pain in the center of her chest that would radiate around to the left side.  Over the last 5 days it has gradually gotten worse to where last night she was not able to get any sleep because the pain was so uncomfortable.  She reports that now it is a very sharp pain that feels kind of like gas in the middle of her chest but then goes around to the left side of her chest and into her back.  It seems worse with any type of movement or taking a deep breath to the point now she feels short of breath because it is so painful.  She has had normal bowel movements, no nausea or vomiting.  She denies any cough, fever but has had some drainage.  She does not use inhalers regularly at home and does not have a history of asthma or COPD.  She denies family history or prior history of blood clots.  She does report changing her medications for her blood pressure approximately 1 week ago her hydrochlorothiazide was increased and she is taking a low-dose of metoprolol.  She has not had any noticeable abdominal pain and reports that eating does not seem to make the pain any worse.  The history is provided by the patient and medical records.  Chest Pain     Home Medications Prior to Admission medications   Medication Sig Start Date End Date Taking? Authorizing Provider  HYDROcodone-acetaminophen (NORCO/VICODIN) 5-325 MG tablet Take 1 tablet by mouth every 6 (six) hours as needed for severe pain. 05/26/21  Yes Esdras Delair, Loree Fee, MD   omeprazole (PRILOSEC) 20 MG capsule Take 1 capsule (20 mg total) by mouth daily. 05/26/21  Yes Jayden Kratochvil, Loree Fee, MD  potassium chloride SA (KLOR-CON M) 20 MEQ tablet Take 1 tablet (20 mEq total) by mouth daily. 05/26/21  Yes Blanchie Dessert, MD  [employer] VITAMIN D 2000 units CAPS Take 2,000 Units by mouth daily. 09/22/15   [provider]  diazepam (VALIUM) 5 MG tablet Take 5 mg by mouth 2 (two) times daily as needed for anxiety.  10/22/15   [provider]  hydrochlorothiazide (HYDRODIURIL) 25 MG tablet Take 1 tablet (25 mg total) by mouth daily. 05/17/21   Skeet Latch, MD  ibuprofen (ADVIL,MOTRIN) 200 MG tablet Take 200-400 mg by mouth every 6 (six) hours as needed for headache (pain).     [provider]  Ipratropium-Albuterol (COMBIVENT RESPIMAT) 20-100 MCG/ACT AERS respimat Inhale 1 puff into the lungs every 6 (six) hours as needed for wheezing or shortness of breath (or coughing). 50/3/54   Delora Fuel, MD  loratadine (CLARITIN) 10 MG tablet Take 10 mg by mouth daily as needed for allergies.  10/08/15   [provider]  metoprolol tartrate (LOPRESSOR) 50 MG tablet Take 50 mg by mouth 2 (two) times daily. 03/27/21   [provider]      Allergies    Latex    Review of Systems  Review of Systems  Cardiovascular:  Positive for chest pain.   Physical Exam Updated Vital Signs BP (!) 127/92   Pulse 61   Temp 98.2 F (36.8 C) (Oral)   Resp 12   Ht '5\' 7"'$  (1.702 m)   Wt 83.2 kg   LMP 06/25/2011   SpO2 99%   BMI 28.74 kg/m  Physical Exam Vitals and nursing note reviewed.  Constitutional:      Appearance: She is well-developed.     Comments: Appears uncomfortable  HENT:     Head: Normocephalic and atraumatic.     Mouth/Throat:     Mouth: Mucous membranes are moist.  Eyes:     Pupils: Pupils are equal, round, and reactive to light.  Cardiovascular:     Rate and Rhythm: Normal rate and regular rhythm.     Pulses: Normal pulses.      Heart sounds: Normal heart sounds. No murmur heard.   No friction rub.  Pulmonary:     Effort: Pulmonary effort is normal.     Breath sounds: Normal breath sounds. No wheezing or rales.  Chest:     Chest wall: Tenderness present.  Abdominal:     General: Bowel sounds are normal. There is no distension.     Palpations: Abdomen is soft.     Tenderness: There is abdominal tenderness in the right upper quadrant, epigastric area and left upper quadrant. There is no right CVA tenderness, left CVA tenderness, guarding or rebound.  Musculoskeletal:        General: No tenderness. Normal range of motion.     Cervical back: Normal range of motion and neck supple. No tenderness.     Right lower leg: No edema.     Left lower leg: No edema.     Comments: No edema  Skin:    General: Skin is warm and dry.     Findings: No rash.  Neurological:     Mental Status: She is alert and oriented to person, place, and time. Mental status is at baseline.     Cranial Nerves: No cranial nerve deficit.  Psychiatric:        Behavior: Behavior normal.    ED Results / Procedures / Treatments   Labs (all labs ordered are listed, but only abnormal results are displayed) Labs Reviewed  CBC WITH DIFFERENTIAL/PLATELET - Abnormal; Notable for the following components:      Result Value   RBC 5.16 (*)    All other components within normal limits  COMPREHENSIVE METABOLIC PANEL - Abnormal; Notable for the following components:   Potassium 3.3 (*)    Creatinine, Ser 1.03 (*)    Total Protein 8.2 (*)    All other components within normal limits  LIPASE, BLOOD  D-DIMER, QUANTITATIVE  TROPONIN I (HIGH SENSITIVITY)    EKG EKG Interpretation  Date/Time:  Thursday May 26 2021 07:19:46 EDT Ventricular Rate:  85 PR Interval:  188 QRS Duration: 91 QT Interval:  376 QTC Calculation: 448 R Axis:   38 Text Interpretation: Sinus rhythm Abnormal R-wave progression, early transition Baseline wander in lead(s) II V4 V5  No significant change since last tracing Confirmed by Blanchie Dessert ([id number]) on 05/26/2021 7:21:52 AM  Radiology DG Chest Port 1 View  Result Date: 05/26/2021 CLINICAL DATA:  chest pain EXAM: PORTABLE CHEST 1 VIEW COMPARISON:  October 23, 2015 and correlation is made with CT of the chest dated June 16, 2020 FINDINGS: The heart size and mediastinal contours are within normal limits.  Both lungs are clear. The visualized skeletal structures are unremarkable. IMPRESSION: No acute pleural or pulmonary process. Electronically Signed   By: Frazier Richards M.D.   On: 05/26/2021 07:53    Procedures Procedures    Medications Ordered in ED Medications  morphine (PF) 4 MG/ML injection 4 mg (4 mg Intravenous Given 05/26/21 0747)  ondansetron (ZOFRAN) injection 4 mg (4 mg Intravenous Given 05/26/21 0746)  ketorolac (TORADOL) 15 MG/ML injection 15 mg (15 mg Intravenous Given 05/26/21 0854)  potassium chloride SA (KLOR-CON M) CR tablet 40 mEq (40 mEq Oral Given 05/26/21 0851)  alum & mag hydroxide-simeth (MAALOX/MYLANTA) 200-200-20 MG/5ML suspension 30 mL (30 mLs Oral Given 05/26/21 0854)    And  lidocaine (XYLOCAINE) 2 % viscous mouth solution 15 mL (15 mLs Oral Given 05/26/21 1610)    ED Course/ Medical Decision Making/ A&P                           Medical Decision Making Amount and/or Complexity of Data Reviewed External Data Reviewed: notes.    Details: From recent cardiology visit Labs: ordered. Decision-making details documented in ED Course. Radiology: ordered and independent interpretation performed. Decision-making details documented in ED Course. ECG/medicine tests: ordered and independent interpretation performed. Decision-making details documented in ED Course.  Risk OTC drugs. Prescription drug management.   Pt with multiple medical problems and comorbidities and presenting today with a complaint that caries a high risk for morbidity and mortality.  Presenting today with complaints of  chest pain.  Patient's chest pain has nonspecific features and seems to be more pleuritic and reproduced with movement and palpation.  However also having some less severe upper abdominal pain.  Concern for atypical ACS versus PE versus pneumonia versus pneumothorax versus upper abdominal pathology such as cholecystitis, pancreatitis however less likely as patient's symptoms are not worsened by eating and she has not had any change in bowel movements nausea or vomiting.  Lower suspicion for dissection at this time.  Patient given pain control.  Vital signs here are reassuring.  I independently interpreted patient's EKG today which appears normal.  Labs and imaging are pending.  9:32 AM I have independently interpreted patient's labs today and she has a negative D-dimer, troponin, CBC and CMP except for mild hypokalemia with a potassium of 3.3 from 4 last week.  Renal function is unchanged.  Feel that the potassium drop is most likely from increasing her hydrochlorothiazide might be a partial component of why she is having pain. I have independently visualized and interpreted pt's images today.  Chest x-ray without acute findings.  At this time low suspicion for dissection, PE, ACS or acute abdominal process.  Suspect most likely pleurisy.  On repeat evaluation after 4 mg of morphine patient's pain is improved but still present.  We will give a dose of Toradol and oral potassium.  Patient will most likely need to start on potassium supplementation as her potassium has dropped after starting hydrochlorothiazide.  Discussed these findings with the patient.  Will reevaluate after Toradol.  Also will give a GI cocktail as pt in the past has had CT findings of possible esophagitis and she does describe a lot of sternal discomfort and has been under a lot of stress recently.  Pt on the cardiac monitor continually without dysrrhythmia.  9:32 AM Patient is feeling better now.  We will start patient on antacid for the  next 2 weeks, she was given a prescription for  potassium but also discussed ways in which she can increase the potassium in her diet.  This time we will avoid NSAIDs due to potential GI source.  Did discuss following up with her cardiologist for repeat potassium levels.  Findings were discussed with the patient.  At this time no indication for admission.  Patient and her daughter are comfortable with this plan.  She was given return precautions.          Final Clinical Impression(s) / ED Diagnoses Final diagnoses:  Hypokalemia  Hypertension, unspecified type  Acute nonspecific chest pain with low risk of coronary artery disease    Rx / DC Orders ED Discharge Orders          Ordered    HYDROcodone-acetaminophen (NORCO/VICODIN) 5-325 MG tablet  Every 6 hours PRN        05/26/21 0932    omeprazole (PRILOSEC) 20 MG capsule  Daily        05/26/21 0932    potassium chloride SA (KLOR-CON M) 20 MEQ tablet  Daily        05/26/21 0932              Blanchie Dessert, MD 05/26/21 [phone]

## 2021-05-26 NOTE — ED Triage Notes (Signed)
Pt arrives to ED with c/o chest pain. This started x5 days ago. Associated symptoms include shortness of breath. Radiation of pain to her back. Aggravating factors include movement.

## 2021-05-26 NOTE — Discharge Instructions (Signed)
No signs of blood clots, infection or heart attack today.  You may have reflux from stress and we will start an anti-acid.  Also your potassium was a little bit low today due to your blood pressure medication.  You can first try to increase the potassium in your diet but you were also given a potassium supplement.  Use the pain medication as needed but you can also take tylenol as needed for pain.  If you start vomiting, fever, severe pain, passing out or unable to get your breath return to the ER.

## 2021-05-27 ENCOUNTER — Encounter (HOSPITAL_BASED_OUTPATIENT_CLINIC_OR_DEPARTMENT_OTHER): Payer: Self-pay | Admitting: Cardiovascular Disease

## 2021-05-27 DIAGNOSIS — E876 Hypokalemia: Secondary | ICD-10-CM

## 2021-05-27 NOTE — Telephone Encounter (Signed)
Discussed messages in office with Dr. Harrell Gave, okay to order labs and get patient scheduled to be seen.

## 2021-05-31 ENCOUNTER — Ambulatory Visit (INDEPENDENT_AMBULATORY_CARE_PROVIDER_SITE_OTHER): Payer: Managed Care, Other (non HMO) | Admitting: Family

## 2021-05-31 ENCOUNTER — Encounter (HOSPITAL_BASED_OUTPATIENT_CLINIC_OR_DEPARTMENT_OTHER): Payer: Self-pay | Admitting: Family

## 2021-05-31 VITALS — BP 114/68 | HR 72 | Ht 67.5 in | Wt 181.6 lb

## 2021-05-31 DIAGNOSIS — E876 Hypokalemia: Secondary | ICD-10-CM

## 2021-05-31 DIAGNOSIS — R0789 Other chest pain: Secondary | ICD-10-CM

## 2021-05-31 DIAGNOSIS — I10 Essential (primary) hypertension: Secondary | ICD-10-CM | POA: Diagnosis not present

## 2021-05-31 LAB — BASIC METABOLIC PANEL
BUN/Creatinine Ratio: 17 (ref 9–23)
BUN: 16 mg/dL (ref 6–24)
CO2: 25 mmol/L (ref 20–29)
Calcium: 9.1 mg/dL (ref 8.7–10.2)
Chloride: 104 mmol/L (ref 96–106)
Creatinine, Ser: 0.96 mg/dL (ref 0.57–1.00)
Glucose: 90 mg/dL (ref 70–99)
Potassium: 3.8 mmol/L (ref 3.5–5.2)
Sodium: 143 mmol/L (ref 134–144)
eGFR: 72 mL/min/{1.73_m2} (ref >59)

## 2021-05-31 MED ORDER — HYDROCHLOROTHIAZIDE 12.5 MG PO CAPS: 12.5000 mg | ORAL_CAPSULE | Freq: Every day | ORAL | 1 refills | Status: AC

## 2021-05-31 MED ORDER — METOPROLOL TARTRATE 50 MG PO TABS: 50.0000 mg | ORAL_TABLET | Freq: Two times a day (BID) | ORAL | 1 refills | Status: AC

## 2021-05-31 NOTE — Progress Notes (Unsigned)
Office Visit    Patient Name: Rebekah [name] Date of Encounter: 06/02/2021  PCP:  [name], [place]  Cardiologist:  Skeet Latch, MD  Advanced Practice Provider:  No care team member to display Electrophysiologist:  None   Chief Complaint    Rebekah [name] is a 51 y.o. female with a hx of hypertension, anxiety, chest pain, hypokalemia presents today for ED follow-up  Past Medical History    Past Medical History:  Diagnosis Date   Anxiety    Hypertension    Past Surgical History:  Procedure Laterality Date   WISDOM TOOTH EXTRACTION      Allergies  Allergies  Allergen Reactions   Latex Rash    History of Present Illness    Rebekah [name] is a 51 y.o. female with a hx of chest pain, hypertension, anxiety, hypokalemia last seen 05/17/2021 by Dr. Oval Linsey.  Prior coronary CTA 2013 with coronary calcium score of 0 and no coronary artery disease.  Prior chest pain thought to be consistent with GERD.  Last seen 05/17/2021 for high blood pressure and headaches.  She has been on antihypertensive for 5 years.  Blood pressure has been recently difficult to control which she attributed to stress and chronic knee pain.  Amlodipine had been previously discontinued due to development of fast heart rate and epistaxis.  She was transitioned to hydrochlorothiazide.  She quit smoking 3 weeks prior.  She was recommended to increase her hydrochlorothiazide to 25 mg daily due to elevated blood pressure.  Plan for repeat BMP in 1 week.  She presented to the ED 05/26/2021 with chest pain consistent with pleuritic pain and was found to be hypokalemic with K 3.3.  Work-up include negative D-dimer, troponin, CBC, CXR. She was treated with morphine, toradol, potassium supplementation, GI cocktail.  She had repeat blood work 05/30/2020 with K3.8.   She presents today for follow-up independently.  She works as a Film/video editor. Tells me the chest pain that  sent her to the emergency department was severe and has not recurred.  She still has residual chest discomfort described as a sharp pain in her right chest as well as epigastric region. Also worse midsternal pain with certain positioning or movements. Discussed likely etiology musculoskeletal and/or GI. She has not been taking her omeprazole regularly.  She has been taking hydrochlorothiazide 12.5 mg daily.  Blood pressure has been well controlled at home.  Reports no shortness of breath, edema, orthopnea, PND, lightheadedness, dizziness. She took 2 tablets of potassium after ED visit but has since self-discontinued. Has completed 4 weeks of Mounjaro and is pleased with response.   EKGs/Labs/Other Studies Reviewed:   The following studies were reviewed today: CT Chest 06/16/2020: FINDINGS: Cardiovascular: Nonaneurysmal aorta. Normal cardiac size. No pericardial effusion   Mediastinum/Nodes: Midline trachea. No thyroid mass. No suspicious adenopathy. Esophagus within normal limits.   Lungs/Pleura: Lungs are clear. No pleural effusion or pneumothorax.   Upper Abdomen: Peripherally enhancing mass in the right hepatic lobe measuring 17 mm consistent with hemangioma. No acute abnormality.   Musculoskeletal: No chest wall abnormality. No acute or significant osseous findings.   IMPRESSION: 1. Negative CT examination of the chest with contrast. 2. 17 mm right hepatic lobe hemangioma   Coronary CTA 12/26/2011: FINDINGS:  Technical quality: Good.  Heart rate:  55.   CORONARY ARTERIES:  Left Main:              Moderate length vessel, which arises  from  the left coronary cusp and is normal.  Gives rise to a ramus  branch, which is moderate in size and unremarkable.  LAD:              Moderate to large vessel.  Without significant  disease.  Tapers distally.  Diagonals:              Small patent first diagonal.  Diminutive  patent second diagonal.  LCx:              Moderate sized,  nondominant vessel.  A short  vessel, which continues as a branching marginal, which is normal.  RCA:              A large, dominant vessel which arises from the  right coronary cusp.  Negative.  PDA:                    Supplied primarily by a distal right  coronary artery branch and patent.  Dominance:        Right-sided   CORONARY CALCIUM:  Total Agatston Score:         0   AORTA AND PULMONARY MEASUREMENTS:   Aortic root (21 - 40 mm):     22 mm at the annulus  28 mm at the sinuses of Valsalva                                 22 mm at the sinotubular  junction                                  Ascending aorta:  (< 40 mm):  27 mm  Descending aorta:  (< 40 mm): 19 mm  Main pulmonary artery: (< 30 mm): 20 mm   OTHER FINDINGS:  Lungs/pleura: No airspace opacities. No pleural fluid.   Heart/Mediastinum: No imaged thoracic adenopathy.  No aortic  dissection.  No pericardial effusion.  Subtle fluid level within  the thoracic esophagus on image 39/series [id number].  Possible distal  esophageal wall thickening.   No left atrial appendage thrombus.  No cardiac mass.  No septal  defect.   Upper abdomen: No significant findings.   Bones/Musculoskeletal:  No acute osseous abnormality.   IMPRESSION:   1.  No coronary artery disease.  2.  Possible distal esophageal wall thickening which could  represent esophagitis. Esophageal air fluid level suggests  dysmotility or gastroesophageal reflux.  3.  No other explanation for patient's symptoms.     EKG:  EKG is ordered today.  The ekg ordered today demonstrates NSR 72 bpm with no acute St/T wave changes.   Recent Labs: 05/24/2021: TSH 1.980 05/26/2021: ALT 12; Hemoglobin 13.6; Platelets 286 05/30/2021: BUN 16; Creatinine, Ser 0.96; Potassium 3.8; Sodium 143  Recent Lipid Panel    Component Value Date/Time   CHOL 250 (H) 05/24/2021 1512   TRIG 137 05/24/2021 1512   HDL 85 05/24/2021 1512   CHOLHDL 2.9 05/24/2021 1512   CHOLHDL 2  04/19/2009 1029   VLDL 6.8 04/19/2009 1029   LDLCALC 141 (H) 05/24/2021 1512     Home Medications   Current Meds  Medication Sig   loratadine (CLARITIN) 10 MG tablet Take 10 mg by mouth daily as needed for allergies.    MOUNJARO 5 MG/0.5ML Pen once a week.   omeprazole (PRILOSEC) 20  MG capsule Take 1 capsule (20 mg total) by mouth daily.   [DISCONTINUED] hydrochlorothiazide (MICROZIDE) 12.5 MG capsule Take 12.5 mg by mouth daily.   [DISCONTINUED] metoprolol tartrate (LOPRESSOR) 50 MG tablet Take 50 mg by mouth 2 (two) times daily.     Review of Systems      All other systems reviewed and are otherwise negative except as noted above.  Physical Exam    VS:  BP 114/68 (BP Location: Left Arm, Patient Position: Sitting, Cuff Size: Large)   Pulse 72   Ht 5' 7.5" (1.715 m)   Wt 181 lb 9.6 oz (82.4 kg)   LMP 06/25/2011   SpO2 98%   BMI 28.02 kg/m  , BMI Body mass index is 28.02 kg/m.  Wt Readings from Last 3 Encounters:  05/31/21 181 lb 9.6 oz (82.4 kg)  05/26/21 183 lb 8 oz (83.2 kg)  05/17/21 183 lb 8 oz (83.2 kg)     GEN: Well nourished, well developed, in no acute distress. HEENT: normal. Neck: Supple, no JVD, carotid bruits, or masses. Cardiac: RRR, no murmurs, rubs, or gallops. No clubbing, cyanosis, edema.  Radials/PT 2+ and equal bilaterally.  Respiratory:  Respirations regular and unlabored, clear to auscultation bilaterally. GI: Soft, nontender, nondistended. MS: No deformity or atrophy. Skin: Warm and dry, no rash. Neuro:  Strength and sensation are intact. Psych: Normal affect.  Assessment & Plan    Chest pain -ED visit with pleuritic chest pain with hypokalemia.  Anticipate her residual pain that is worse with movement likely pleuritic and encouraged heat pack, rest.  Due to epigastric discomfort recommend regular utilization of her PPI.  No indication for ischemic evaluation as chest pain is atypical for angina.  HTN - BP well controlled. Continue current  antihypertensive regimen Lopressor 50 mg twice daily, hydrochlorothiazide 12.5 mg daily.  Previously with hypokalemia on higher dose hydrochlorothiazide and would require potassium supplementation if increased dose needed.  Hypokalemia -ED visit with K3.3 in the setting of increased dose hydrochlorothiazide.  Most recent labs 05/30/2020 with K3.8.  Continue hydrochlorothiazide 12.5 mg daily.  No indication for potassium supplementation at this time.  Encouraged dietary sources of potassium .       Disposition: Follow up in 2 week(s) with pharmacy team as scheduled to ensure BP remains at goal.  Signed, Loel Dubonnet, NP 06/02/2021, 1:10 PM [place]

## 2021-05-31 NOTE — Patient Instructions (Signed)
Medication Instructions:   Continue current medications.   *If you need a refill on your cardiac medications before your next appointment, please call your pharmacy*  Lab Work: None ordered today.   Testing/Procedures: Your EKG today shows normal sinus rhythm.    Follow-Up: At [facility], you and your health needs are our priority.  As part of our continuing mission to provide you with exceptional heart care, we have created designated Provider Care Teams.  These Care Teams include your primary Cardiologist (physician) and Advanced Practice Providers (APPs -  Physician Assistants and Nurse Practitioners) who all work together to provide you with the care you need, when you need it.  We recommend signing up for the patient portal called "MyChart".  Sign up information is provided on this After Visit Summary.  MyChart is used to connect with patients for Virtual Visits (Telemedicine).  Patients are able to view lab/test results, encounter notes, upcoming appointments, etc.  Non-urgent messages can be sent to your provider as well.   To learn more about what you can do with MyChart, go to [email or web address].    Your next appointment:   As scheduled with pharmacist   Other Instructions  Try a heat pack on your chest.   Recommend taking your Omeprazole once per day for a week then return to using as needed.

## 2021-06-15 ENCOUNTER — Ambulatory Visit (HOSPITAL_BASED_OUTPATIENT_CLINIC_OR_DEPARTMENT_OTHER): Payer: Managed Care, Other (non HMO)

## 2021-07-13 ENCOUNTER — Encounter: Payer: Self-pay | Admitting: *Deleted

## 2021-11-14 ENCOUNTER — Ambulatory Visit (HOSPITAL_BASED_OUTPATIENT_CLINIC_OR_DEPARTMENT_OTHER): Payer: Managed Care, Other (non HMO) | Admitting: Cardiovascular Disease

## 2022-05-08 ENCOUNTER — Other Ambulatory Visit (HOSPITAL_BASED_OUTPATIENT_CLINIC_OR_DEPARTMENT_OTHER): Payer: Self-pay | Admitting: Family

## 2022-05-08 DIAGNOSIS — I10 Essential (primary) hypertension: Secondary | ICD-10-CM

## 2022-05-08 NOTE — Telephone Encounter (Signed)
Please call pt to schedule appointment with Gillian Shields, NP or Dr. Duke Salvia for refills. Last seen 05/2021--was supposed to follow-up 2 weeks after with a pharmacist for BP check, however did not do this. Thank you!

## 2022-05-09 NOTE — Telephone Encounter (Signed)
Left message for patient to call and schedule overdue follow up  with Dr. Chataignier / APP for medication refills 

## 2022-05-16 NOTE — Telephone Encounter (Signed)
Patient is HTN clinic patient will forward to [facility]

## 2022-05-17 NOTE — Telephone Encounter (Signed)
LVM to schedule HTN Clinic f/u.

## 2022-05-19 ENCOUNTER — Telehealth: Payer: Self-pay | Admitting: Cardiovascular Disease

## 2022-05-19 ENCOUNTER — Ambulatory Visit: Payer: Self-pay | Admitting: Plastic Surgery

## 2022-05-19 MED ORDER — TRANEXAMIC ACID 1000 MG/10ML IV SOLN
1000.0000 mg | INTRAVENOUS | Status: AC
Start: 2022-05-19 — End: 2022-05-20

## 2022-05-19 NOTE — Pre-Procedure Instructions (Signed)
Surgical Instructions    Your procedure is scheduled on Tuesday, May 21st.  Report to [facility] Main Entrance "A" at 05:30 A.M., then check in with the Admitting office.  Call this number if you have problems the morning of surgery:  [phone]  If you have any questions prior to your surgery date call [phone]: Open Monday-Friday 8am-4pm If you experience any cold or flu symptoms such as cough, fever, chills, shortness of breath, etc. between now and your scheduled surgery, please notify us at the above number.     Remember:  Do not eat after midnight the night before your surgery  You may drink clear liquids until 04:30 AM the morning of your surgery.   Clear liquids allowed are: Water, Non-Citrus Juices (without pulp), Carbonated Beverages, Clear Tea, Black Coffee Only (NO MILK, CREAM OR POWDERED CREAMER of any kind), and Gatorade.    Take these medicines the morning of surgery with A SIP OF WATER  metoprolol tartrate (LOPRESSOR)  omeprazole (PRILOSEC)   If needed: loratadine (CLARITIN)    Stop taking MOUNJARO 7 days prior to surgery.    As of today, STOP taking any Aspirin (unless otherwise instructed by your surgeon) Aleve, Naproxen, Ibuprofen, Motrin, Advil, Goody's, BC's, all herbal medications, fish oil, and all vitamins.                     Do NOT Smoke (Tobacco/Vaping) for 24 hours prior to your procedure.  If you use a CPAP at night, you may bring your mask/headgear for your overnight stay.   Contacts, glasses, piercing's, hearing aid's, dentures or partials may not be worn into surgery, please bring cases for these belongings.    For patients admitted to the hospital, discharge time will be determined by your treatment team.   Patients discharged the day of surgery will not be allowed to drive home, and someone needs to stay with them for 24 hours.  SURGICAL WAITING ROOM VISITATION Patients having surgery or a procedure may have no more than 2 support  people in the waiting area - these visitors may rotate.   Children under the age of 41 must have an adult with them who is not the patient. If the patient needs to stay at the hospital during part of their recovery, the visitor guidelines for inpatient rooms apply. Pre-op nurse will coordinate an appropriate time for 1 support person to accompany patient in pre-op.  This support person may not rotate.   Please refer to the [facility] website for the visitor guidelines for Inpatients (after your surgery is over and you are in a regular room).    Special instructions:   [place]- Preparing For Surgery  Before surgery, you can play an important role. Because skin is not sterile, your skin needs to be as free of germs as possible. You can reduce the number of germs on your skin by washing with CHG (chlorahexidine gluconate) Soap before surgery.  CHG is an antiseptic cleaner which kills germs and bonds with the skin to continue killing germs even after washing.    Oral Hygiene is also important to reduce your risk of infection.  Remember - BRUSH YOUR TEETH THE MORNING OF SURGERY WITH YOUR REGULAR TOOTHPASTE  Please do not use if you have an allergy to CHG or antibacterial soaps. If your skin becomes reddened/irritated stop using the CHG.  Do not shave (including legs and underarms) for at least 48 hours prior to first CHG shower. It is  OK to shave your face.  Please follow these instructions carefully.   Shower the NIGHT BEFORE SURGERY and the MORNING OF SURGERY  If you chose to wash your hair, wash your hair first as usual with your normal shampoo.  After you shampoo, rinse your hair and body thoroughly to remove the shampoo.  Use CHG Soap as you would any other liquid soap. You can apply CHG directly to the skin and wash gently with a scrungie or a clean washcloth.   Apply the CHG Soap to your body ONLY FROM THE NECK DOWN.  Do not use on open wounds or open sores. Avoid contact with your  eyes, ears, mouth and genitals (private parts). Wash Face and genitals (private parts)  with your normal soap.   Wash thoroughly, paying special attention to the area where your surgery will be performed.  Thoroughly rinse your body with warm water from the neck down.  DO NOT shower/wash with your normal soap after using and rinsing off the CHG Soap.  Pat yourself dry with a CLEAN TOWEL.  Wear CLEAN PAJAMAS to bed the night before surgery  Place CLEAN SHEETS on your bed the night before your surgery  DO NOT SLEEP WITH PETS.   Day of Surgery: Take a shower with CHG soap. Do not wear jewelry or makeup Do not wear lotions, powders, perfumes, or deodorant. Do not shave 48 hours prior to surgery.   Do not bring valuables to the hospital. [facility] is not responsible for any belongings or valuables. Do not wear nail polish, gel polish, artificial nails, or any other type of covering on natural nails (fingers and toes) If you have artificial nails or gel coating that need to be removed by a nail salon, please have this removed prior to surgery. Artificial nails or gel coating may interfere with anesthesia's ability to adequately monitor your vital signs. Wear Clean/Comfortable clothing the morning of surgery Remember to brush your teeth WITH YOUR REGULAR TOOTHPASTE.   Please read over the following fact sheets that you were given.    If you received a COVID test during your pre-op visit  it is requested that you wear a mask when out in public, stay away from anyone that may not be feeling well and notify your surgeon if you develop symptoms. If you have been in contact with anyone that has tested positive in the last 10 days please notify you surgeon.

## 2022-05-19 NOTE — Telephone Encounter (Signed)
Patient states she does not want to schedule with Dr. Duke Salvia and does not want to receive anymore calls from the office.

## 2022-05-19 NOTE — Telephone Encounter (Signed)
Patient states she does not want to schedule with Dr. [place] and does not want to receive anymore calls from the office.  

## 2022-05-22 ENCOUNTER — Other Ambulatory Visit: Payer: Self-pay

## 2022-05-22 ENCOUNTER — Encounter (HOSPITAL_COMMUNITY): Payer: Self-pay

## 2022-05-22 ENCOUNTER — Encounter (HOSPITAL_COMMUNITY)
Admission: RE | Admit: 2022-05-22 | Discharge: 2022-05-22 | Disposition: A | Discharge status: Home / Self Care | Payer: No Typology Code available for payment source | Source: Ambulatory Visit | Attending: Plastic Surgery | Admitting: Plastic Surgery

## 2022-05-22 VITALS — BP 131/82 | HR 65 | Temp 97.9°F | Resp 17 | Ht 67.5 in | Wt 172.0 lb

## 2022-05-22 DIAGNOSIS — I2510 Atherosclerotic heart disease of native coronary artery without angina pectoris: Secondary | ICD-10-CM | POA: Diagnosis not present

## 2022-05-22 DIAGNOSIS — Z01812 Encounter for preprocedural laboratory examination: Secondary | ICD-10-CM | POA: Insufficient documentation

## 2022-05-22 DIAGNOSIS — Z01818 Encounter for other preprocedural examination: Secondary | ICD-10-CM

## 2022-05-22 HISTORY — DX: Allergy, unspecified, initial encounter: T78.40XA

## 2022-05-22 HISTORY — DX: Unspecified osteoarthritis, unspecified site: M19.90

## 2022-05-22 LAB — CBC
HCT: 42.6 % (ref 36.0–46.0)
Hemoglobin: 13.7 g/dL (ref 12.0–15.0)
MCH: 26.4 pg (ref 26.0–34.0)
MCHC: 32.2 g/dL (ref 30.0–36.0)
MCV: 82.2 fL (ref 80.0–100.0)
Platelets: 275 10*3/uL (ref 150–400)
RBC: 5.18 MIL/uL — ABNORMAL HIGH (ref 3.87–5.11)
RDW: 14.4 % (ref 11.5–15.5)
WBC: 5.3 10*3/uL (ref 4.0–10.5)
nRBC: 0 % (ref 0.0–0.2)

## 2022-05-22 LAB — BASIC METABOLIC PANEL
Anion gap: 9 (ref 5–15)
BUN: 14 mg/dL (ref 6–20)
CO2: 28 mmol/L (ref 22–32)
Calcium: 8.9 mg/dL (ref 8.9–10.3)
Chloride: 103 mmol/L (ref 98–111)
Creatinine, Ser: 1.01 mg/dL — ABNORMAL HIGH (ref 0.44–1.00)
GFR, Estimated: >60 mL/min (ref >60)
Glucose, Bld: 90 mg/dL (ref 70–99)
Potassium: 3.4 mmol/L — ABNORMAL LOW (ref 3.5–5.1)
Sodium: 140 mmol/L (ref 135–145)

## 2022-05-22 NOTE — Progress Notes (Signed)
PCP - Diamantina Providence, FNP- Dr. Alysia Penna  Cardiologist - Chilton Si, MD- last seen in May 2023- pt reports she was in hospital after being started on HCTZ for her BP by Dr. Duke Salvia which caused hypokalemia. Pt states she does not see Dr. Duke Salvia anymore. Pt denies any cardiac symptoms or cardiac tests.    PPM/ICD - denies   Chest x-ray - denies EKG - 05/31/21 Stress Test - denies ECHO - denies Cardiac Cath - denies  Sleep Study - denies   Fasting Blood Sugar - N/A   Last dose of GLP1 agonist-  Mounjaro last dose 05/10/22   Blood Thinner Instructions: N/A Aspirin Instructions:N/A  ERAS Protcol - ERAS per protocol   COVID TEST- N/A  Pt was given [place] by surgeon to take prior to and after surgery.   Anesthesia review: no  Patient denies shortness of breath, fever, cough and chest pain at PAT appointment   All instructions explained to the patient, with a verbal understanding of the material. Patient agrees to go over the instructions while at home for a better understanding. Patient also instructed to self quarantine after being tested for COVID-19. The opportunity to ask questions was provided.

## 2022-05-22 NOTE — Pre-Procedure Instructions (Signed)
Surgical Instructions    Your procedure is scheduled on Tuesday, May 21st.  Report to [facility] Main Entrance "A" at 05:30 A.M., then check in with the Admitting office.  Call this number if you have problems the morning of surgery:  [phone]  If you have any questions prior to your surgery date call [phone]: Open Monday-Friday 8am-4pm If you experience any cold or flu symptoms such as cough, fever, chills, shortness of breath, etc. between now and your scheduled surgery, please notify us at the above number.     Remember:  Do not eat after midnight the night before your surgery  You may drink clear liquids until 04:30 AM the morning of your surgery.   Clear liquids allowed are: Water, Non-Citrus Juices (without pulp), Carbonated Beverages, Clear Tea, Black Coffee Only (NO MILK, CREAM OR POWDERED CREAMER of any kind), and Gatorade.    Take these medicines the morning of surgery with A SIP OF WATER  metoprolol tartrate (LOPRESSOR)  omeprazole (PRILOSEC)  Flonase montelukast  If needed: Combivent- bring to hospital with you Tylenol if needed   Stop taking MOUNJARO 7 days prior to surgery. Do not take after 05/22/22   As of today, STOP taking any Aspirin (unless otherwise instructed by your surgeon) Aleve, Naproxen, Ibuprofen, Motrin, Advil, Goody's, BC's, all herbal medications, fish oil, and all vitamins.                     Do NOT Smoke (Tobacco/Vaping) for 24 hours prior to your procedure.  If you use a CPAP at night, you may bring your mask/headgear for your overnight stay.   Contacts, glasses, piercing's, hearing aid's, dentures or partials may not be worn into surgery, please bring cases for these belongings.    For patients admitted to the hospital, discharge time will be determined by your treatment team.   Patients discharged the day of surgery will not be allowed to drive home, and someone needs to stay with them for 24 hours.  SURGICAL WAITING ROOM  VISITATION Patients having surgery or a procedure may have no more than 2 support people in the waiting area - these visitors may rotate.   Children under the age of 32 must have an adult with them who is not the patient. If the patient needs to stay at the hospital during part of their recovery, the visitor guidelines for inpatient rooms apply. Pre-op nurse will coordinate an appropriate time for 1 support person to accompany patient in pre-op.  This support person may not rotate.   Please refer to the [facility] website for the visitor guidelines for Inpatients (after your surgery is over and you are in a regular room).    Special instructions:   [place]- Preparing For Surgery  Before surgery, you can play an important role. Because skin is not sterile, your skin needs to be as free of germs as possible. You can reduce the number of germs on your skin by washing with CHG (chlorahexidine gluconate) Soap before surgery.  CHG is an antiseptic cleaner which kills germs and bonds with the skin to continue killing germs even after washing.    Oral Hygiene is also important to reduce your risk of infection.  Remember - BRUSH YOUR TEETH THE MORNING OF SURGERY WITH YOUR REGULAR TOOTHPASTE  Please do not use if you have an allergy to CHG or antibacterial soaps. If your skin becomes reddened/irritated stop using the CHG.  Do not shave (including legs and underarms)  for at least 48 hours prior to first CHG shower. It is OK to shave your face.  Please follow these instructions carefully.   Shower the NIGHT BEFORE SURGERY and the MORNING OF SURGERY  If you chose to wash your hair, wash your hair first as usual with your normal shampoo.  After you shampoo, rinse your hair and body thoroughly to remove the shampoo.  Use CHG Soap as you would any other liquid soap. You can apply CHG directly to the skin and wash gently with a scrungie or a clean washcloth.   Apply the CHG Soap to your body ONLY  FROM THE NECK DOWN.  Do not use on open wounds or open sores. Avoid contact with your eyes, ears, mouth and genitals (private parts). Wash Face and genitals (private parts)  with your normal soap.   Wash thoroughly, paying special attention to the area where your surgery will be performed.  Thoroughly rinse your body with warm water from the neck down.  DO NOT shower/wash with your normal soap after using and rinsing off the CHG Soap.  Pat yourself dry with a CLEAN TOWEL.  Wear CLEAN PAJAMAS to bed the night before surgery  Place CLEAN SHEETS on your bed the night before your surgery  DO NOT SLEEP WITH PETS.   Day of Surgery: Take a shower with CHG soap. Do not wear jewelry or makeup Do not wear lotions, powders, perfumes, or deodorant. Do not shave 48 hours prior to surgery.   Do not bring valuables to the hospital. [facility] is not responsible for any belongings or valuables. Do not wear nail polish, gel polish, artificial nails, or any other type of covering on natural nails (fingers and toes) If you have artificial nails or gel coating that need to be removed by a nail salon, please have this removed prior to surgery. Artificial nails or gel coating may interfere with anesthesia's ability to adequately monitor your vital signs. Wear Clean/Comfortable clothing the morning of surgery Remember to brush your teeth WITH YOUR REGULAR TOOTHPASTE.   Please read over the following fact sheets that you were given.    If you received a COVID test during your pre-op visit  it is requested that you wear a mask when out in public, stay away from anyone that may not be feeling well and notify your surgeon if you develop symptoms. If you have been in contact with anyone that has tested positive in the last 10 days please notify you surgeon.

## 2022-05-23 ENCOUNTER — Other Ambulatory Visit (HOSPITAL_COMMUNITY): Payer: Managed Care, Other (non HMO)

## 2022-05-29 ENCOUNTER — Encounter (HOSPITAL_COMMUNITY): Payer: Self-pay | Admitting: Plastic Surgery

## 2022-05-29 NOTE — Anesthesia Preprocedure Evaluation (Signed)
Anesthesia Evaluation  Patient identified by MRN, date of birth, ID band Patient awake    Reviewed: Allergy & Precautions, NPO status , Patient's Chart, lab work & pertinent test results  Airway Mallampati: II  TM Distance: >3 FB Neck ROM: Full    Dental no notable dental hx. (+) Teeth Intact, Dental Advisory Given, Caps, Chipped,    Pulmonary former smoker Reactive airway   Pulmonary exam normal breath sounds clear to auscultation       Cardiovascular hypertension, Pt. on medications and Pt. on home beta blockers Normal cardiovascular exam Rhythm:Regular Rate:Normal     Neuro/Psych  PSYCHIATRIC DISORDERS Anxiety     negative neurological ROS     GI/Hepatic Neg liver ROS,GERD  Medicated,,  Endo/Other  Bilateral Mammary hypertrophy GLP-1 RA therapy- last dose 5/1  Renal/GU negative Renal ROS  negative genitourinary   Musculoskeletal  (+) Arthritis , Osteoarthritis,    Abdominal   Peds  Hematology  (+) Blood dyscrasia, anemia   Anesthesia Other Findings   Reproductive/Obstetrics                             Anesthesia Physical Anesthesia Plan  ASA: 2  Anesthesia Plan: General   Post-op Pain Management: Precedex, Dilaudid IV and Ofirmev IV (intra-op)*   Induction: Intravenous  PONV Risk Score and Plan: 4 or greater and Treatment may vary due to age or medical condition, Midazolam, Dexamethasone and Ondansetron  Airway Management Planned: Oral ETT  Additional Equipment: None  Intra-op Plan:   Post-operative Plan: Extubation in OR  Informed Consent: I have reviewed the patients History and Physical, chart, labs and discussed the procedure including the risks, benefits and alternatives for the proposed anesthesia with the patient or authorized representative who has indicated his/her understanding and acceptance.     Dental advisory given  Plan Discussed with: CRNA and  Anesthesiologist  Anesthesia Plan Comments:        Anesthesia Quick Evaluation

## 2022-05-30 ENCOUNTER — Encounter (HOSPITAL_COMMUNITY): Payer: Self-pay | Admitting: Plastic Surgery

## 2022-05-30 ENCOUNTER — Other Ambulatory Visit: Payer: Self-pay

## 2022-05-30 ENCOUNTER — Ambulatory Visit (HOSPITAL_COMMUNITY): Payer: No Typology Code available for payment source | Admitting: Certified Registered Nurse Anesthetist

## 2022-05-30 ENCOUNTER — Encounter (HOSPITAL_COMMUNITY)
Admission: RE | Disposition: A | Discharge status: Home / Self Care | Payer: Self-pay | Source: Ambulatory Visit | Attending: Plastic Surgery

## 2022-05-30 ENCOUNTER — Ambulatory Visit (HOSPITAL_COMMUNITY)
Admission: RE | Admit: 2022-05-30 | Discharge: 2022-05-30 | Disposition: A | Discharge status: Home / Self Care | Payer: No Typology Code available for payment source | Source: Ambulatory Visit | Attending: Plastic Surgery | Admitting: Plastic Surgery

## 2022-05-30 ENCOUNTER — Ambulatory Visit (HOSPITAL_BASED_OUTPATIENT_CLINIC_OR_DEPARTMENT_OTHER): Payer: No Typology Code available for payment source | Admitting: Certified Registered Nurse Anesthetist

## 2022-05-30 DIAGNOSIS — I10 Essential (primary) hypertension: Secondary | ICD-10-CM

## 2022-05-30 DIAGNOSIS — D649 Anemia, unspecified: Secondary | ICD-10-CM | POA: Insufficient documentation

## 2022-05-30 DIAGNOSIS — Z87891 Personal history of nicotine dependence: Secondary | ICD-10-CM | POA: Insufficient documentation

## 2022-05-30 DIAGNOSIS — K219 Gastro-esophageal reflux disease without esophagitis: Secondary | ICD-10-CM | POA: Diagnosis not present

## 2022-05-30 DIAGNOSIS — F419 Anxiety disorder, unspecified: Secondary | ICD-10-CM

## 2022-05-30 DIAGNOSIS — M1990 Unspecified osteoarthritis, unspecified site: Secondary | ICD-10-CM | POA: Insufficient documentation

## 2022-05-30 DIAGNOSIS — N62 Hypertrophy of breast: Secondary | ICD-10-CM

## 2022-05-30 HISTORY — PX: BREAST REDUCTION SURGERY: SHX8

## 2022-05-30 SURGERY — MAMMOPLASTY, REDUCTION
Anesthesia: General | Laterality: Bilateral

## 2022-05-30 MED ORDER — CHLORHEXIDINE GLUCONATE CLOTH 2 % EX PADS: 6.0000 | MEDICATED_PAD | Freq: Once | CUTANEOUS | Status: DC

## 2022-05-30 MED ORDER — GLYCOPYRROLATE PF 0.2 MG/ML IJ SOSY
PREFILLED_SYRINGE | INTRAMUSCULAR | Status: AC
  Filled 2022-05-30: qty 1

## 2022-05-30 MED ORDER — BUPIVACAINE HCL (PF) 0.25 % IJ SOLN
INTRAMUSCULAR | Status: AC
  Filled 2022-05-30: qty 30

## 2022-05-30 MED ORDER — BACITRACIN ZINC 500 UNIT/GM EX OINT
TOPICAL_OINTMENT | CUTANEOUS | Status: DC | PRN
  Administered 2022-05-30: 1 via TOPICAL

## 2022-05-30 MED ORDER — ONDANSETRON HCL 4 MG/2ML IJ SOLN
INTRAMUSCULAR | Status: DC | PRN
  Administered 2022-05-30: 4 mg via INTRAVENOUS

## 2022-05-30 MED ORDER — CEFAZOLIN SODIUM-DEXTROSE 2-4 GM/100ML-% IV SOLN
2.0000 g | INTRAVENOUS | Status: AC
  Administered 2022-05-30 (×2): 2 g via INTRAVENOUS
  Filled 2022-05-30: qty 100

## 2022-05-30 MED ORDER — OXYCODONE HCL 5 MG PO TABS
ORAL_TABLET | ORAL | Status: AC
  Filled 2022-05-30: qty 1

## 2022-05-30 MED ORDER — LIDOCAINE-EPINEPHRINE 1 %-1:100000 IJ SOLN
INTRAMUSCULAR | Status: DC | PRN
  Administered 2022-05-30: 20 mL

## 2022-05-30 MED ORDER — HYDROMORPHONE HCL 1 MG/ML IJ SOLN
INTRAMUSCULAR | Status: DC | PRN
  Administered 2022-05-30: .5 mg via INTRAVENOUS

## 2022-05-30 MED ORDER — DEXAMETHASONE SODIUM PHOSPHATE 10 MG/ML IJ SOLN
INTRAMUSCULAR | Status: AC
  Filled 2022-05-30: qty 1

## 2022-05-30 MED ORDER — HYDROMORPHONE HCL 1 MG/ML IJ SOLN
INTRAMUSCULAR | Status: AC
  Filled 2022-05-30: qty 0.5

## 2022-05-30 MED ORDER — SODIUM CHLORIDE (PF) 0.9 % IJ SOLN
INTRAMUSCULAR | Status: DC | PRN
  Administered 2022-05-30 (×2): 35 mL
  Administered 2022-05-30: 20 mL

## 2022-05-30 MED ORDER — ORAL CARE MOUTH RINSE: 15.0000 mL | Freq: Once | OROMUCOSAL | Status: AC

## 2022-05-30 MED ORDER — BACITRACIN ZINC 500 UNIT/GM EX OINT
TOPICAL_OINTMENT | CUTANEOUS | Status: AC
  Filled 2022-05-30: qty 28.35

## 2022-05-30 MED ORDER — MIDAZOLAM HCL 2 MG/2ML IJ SOLN
INTRAMUSCULAR | Status: DC | PRN
  Administered 2022-05-30: 2 mg via INTRAVENOUS

## 2022-05-30 MED ORDER — OXYCODONE HCL 5 MG PO TABS
5.0000 mg | ORAL_TABLET | Freq: Once | ORAL | Status: AC | PRN
  Administered 2022-05-30: 5 mg via ORAL

## 2022-05-30 MED ORDER — OXYCODONE HCL 5 MG/5ML PO SOLN: 5.0000 mg | Freq: Once | ORAL | Status: AC | PRN

## 2022-05-30 MED ORDER — DROPERIDOL 2.5 MG/ML IJ SOLN: 0.6250 mg | Freq: Once | INTRAMUSCULAR | Status: DC | PRN

## 2022-05-30 MED ORDER — ROCURONIUM BROMIDE 10 MG/ML (PF) SYRINGE
PREFILLED_SYRINGE | INTRAVENOUS | Status: DC | PRN
  Administered 2022-05-30: 80 mg via INTRAVENOUS

## 2022-05-30 MED ORDER — ONDANSETRON HCL 4 MG/2ML IJ SOLN
INTRAMUSCULAR | Status: AC
  Filled 2022-05-30: qty 2

## 2022-05-30 MED ORDER — SODIUM CHLORIDE (PF) 0.9 % IJ SOLN
INTRAMUSCULAR | Status: AC
  Filled 2022-05-30: qty 10

## 2022-05-30 MED ORDER — BUPIVACAINE LIPOSOME 1.3 % IJ SUSP
INTRAMUSCULAR | Status: AC
  Filled 2022-05-30: qty 20

## 2022-05-30 MED ORDER — BUPIVACAINE HCL (PF) 0.25 % IJ SOLN
INTRAMUSCULAR | Status: DC | PRN
  Administered 2022-05-30: 15 mL
  Administered 2022-05-30: 20 mL
  Administered 2022-05-30: 15 mL

## 2022-05-30 MED ORDER — MIDAZOLAM HCL 2 MG/2ML IJ SOLN
INTRAMUSCULAR | Status: AC
  Filled 2022-05-30: qty 2

## 2022-05-30 MED ORDER — FENTANYL CITRATE (PF) 250 MCG/5ML IJ SOLN
INTRAMUSCULAR | Status: DC | PRN
  Administered 2022-05-30: 150 ug via INTRAVENOUS
  Administered 2022-05-30 (×2): 50 ug via INTRAVENOUS

## 2022-05-30 MED ORDER — DEXAMETHASONE SODIUM PHOSPHATE 10 MG/ML IJ SOLN
INTRAMUSCULAR | Status: DC | PRN
  Administered 2022-05-30: 10 mg via INTRAVENOUS

## 2022-05-30 MED ORDER — CHLORHEXIDINE GLUCONATE 0.12 % MT SOLN
15.0000 mL | Freq: Once | OROMUCOSAL | Status: AC
  Filled 2022-05-30: qty 15

## 2022-05-30 MED ORDER — NEOSTIGMINE METHYLSULFATE 3 MG/3ML IV SOSY
PREFILLED_SYRINGE | INTRAVENOUS | Status: AC
  Filled 2022-05-30: qty 3

## 2022-05-30 MED ORDER — ALBUMIN HUMAN 5 % IV SOLN: INTRAVENOUS | Status: DC | PRN

## 2022-05-30 MED ORDER — PROPOFOL 10 MG/ML IV BOLUS
INTRAVENOUS | Status: DC | PRN
  Administered 2022-05-30: 180 mg via INTRAVENOUS

## 2022-05-30 MED ORDER — BUPIVACAINE LIPOSOME 1.3 % IJ SUSP
INTRAMUSCULAR | Status: DC | PRN
  Administered 2022-05-30 (×2): 10 mL

## 2022-05-30 MED ORDER — LIDOCAINE-EPINEPHRINE 1 %-1:100000 IJ SOLN
INTRAMUSCULAR | Status: AC
  Filled 2022-05-30: qty 1

## 2022-05-30 MED ORDER — LIDOCAINE 2% (20 MG/ML) 5 ML SYRINGE
INTRAMUSCULAR | Status: DC | PRN
  Administered 2022-05-30: 60 mg via INTRAVENOUS

## 2022-05-30 MED ORDER — LACTATED RINGERS IV SOLN: INTRAVENOUS | Status: DC

## 2022-05-30 MED ORDER — HYDROMORPHONE HCL 1 MG/ML IJ SOLN
0.2500 mg | INTRAMUSCULAR | Status: DC | PRN
  Administered 2022-05-30 (×2): 0.5 mg via INTRAVENOUS

## 2022-05-30 MED ORDER — PHENYLEPHRINE 80 MCG/ML (10ML) SYRINGE FOR IV PUSH (FOR BLOOD PRESSURE SUPPORT)
PREFILLED_SYRINGE | INTRAVENOUS | Status: DC | PRN
  Administered 2022-05-30 (×2): 160 ug via INTRAVENOUS

## 2022-05-30 MED ORDER — 0.9 % SODIUM CHLORIDE (POUR BTL) OPTIME
TOPICAL | Status: DC | PRN
  Administered 2022-05-30: 3000 mL

## 2022-05-30 MED ORDER — HYDROMORPHONE HCL 1 MG/ML IJ SOLN
INTRAMUSCULAR | Status: AC
  Filled 2022-05-30: qty 1

## 2022-05-30 MED ORDER — NEOSTIGMINE METHYLSULFATE 10 MG/10ML IV SOLN
INTRAVENOUS | Status: DC | PRN
  Administered 2022-05-30: 1 mg via INTRAVENOUS

## 2022-05-30 MED ORDER — LIDOCAINE 2% (20 MG/ML) 5 ML SYRINGE
INTRAMUSCULAR | Status: AC
  Filled 2022-05-30: qty 5

## 2022-05-30 MED ORDER — ROCURONIUM BROMIDE 10 MG/ML (PF) SYRINGE
PREFILLED_SYRINGE | INTRAVENOUS | Status: AC
  Filled 2022-05-30: qty 10

## 2022-05-30 MED ORDER — PROPOFOL 500 MG/50ML IV EMUL
INTRAVENOUS | Status: DC | PRN
  Administered 2022-05-30: 30 ug/kg/min via INTRAVENOUS

## 2022-05-30 MED ORDER — PHENYLEPHRINE HCL-NACL 20-0.9 MG/250ML-% IV SOLN
INTRAVENOUS | Status: DC | PRN
  Administered 2022-05-30: 45 ug/min via INTRAVENOUS

## 2022-05-30 MED ORDER — FENTANYL CITRATE (PF) 250 MCG/5ML IJ SOLN
INTRAMUSCULAR | Status: AC
  Filled 2022-05-30: qty 5

## 2022-05-30 MED ORDER — PROPOFOL 10 MG/ML IV BOLUS
INTRAVENOUS | Status: AC
  Filled 2022-05-30: qty 20

## 2022-05-30 MED ORDER — GLYCOPYRROLATE PF 0.2 MG/ML IJ SOSY
PREFILLED_SYRINGE | INTRAMUSCULAR | Status: DC | PRN
  Administered 2022-05-30: .2 mg via INTRAVENOUS

## 2022-05-30 MED ORDER — ONDANSETRON HCL 4 MG/2ML IJ SOLN: 4.0000 mg | Freq: Once | INTRAMUSCULAR | Status: DC | PRN

## 2022-05-30 MED ORDER — CHLORHEXIDINE GLUCONATE 0.12 % MT SOLN
15.0000 mL | Freq: Once | OROMUCOSAL | Status: AC
  Administered 2022-05-30: 15 mL via OROMUCOSAL

## 2022-05-30 SURGICAL SUPPLY — 48 items
ALCOHOL 70% 16 OZ (MISCELLANEOUS) IMPLANT
BAG COUNTER SPONGE SURGICOUNT (BAG) ×2 IMPLANT
BAG SPNG CNTER NS LX DISP (BAG) ×1
BNDG GAUZE DERMACEA FLUFF 4 (GAUZE/BANDAGES/DRESSINGS) IMPLANT
BNDG GZE DERMACEA 4 6PLY (GAUZE/BANDAGES/DRESSINGS)
CANISTER SUCT 3000ML PPV (MISCELLANEOUS) ×2 IMPLANT
COUNTER NDL 20CT MAGNET RED (NEEDLE) IMPLANT
DRAIN CHANNEL 10F 3/8 F FF (DRAIN) ×4 IMPLANT
DRAPE HALF SHEET 40X57 (DRAPES) ×4 IMPLANT
DRSG EMULSION OIL 3X3 NADH (GAUZE/BANDAGES/DRESSINGS) IMPLANT
DRSG TELFA 3X8 NADH STRL (GAUZE/BANDAGES/DRESSINGS) IMPLANT
ELECT CAUTERY BLADE 6.4 (BLADE) ×2 IMPLANT
ELECT REM PT RETURN 9FT ADLT (ELECTROSURGICAL) ×1
ELECTRODE REM PT RTRN 9FT ADLT (ELECTROSURGICAL) ×2 IMPLANT
EVACUATOR SILICONE 100CC (DRAIN) ×4 IMPLANT
GAUZE PAD ABD 8X10 STRL (GAUZE/BANDAGES/DRESSINGS) IMPLANT
GAUZE SPONGE 4X4 12PLY STRL (GAUZE/BANDAGES/DRESSINGS) ×2 IMPLANT
GLOVE BIO SURGEON STRL SZ 6.5 (GLOVE) ×4 IMPLANT
GLOVE SURG SS PI 6.5 STRL IVOR (GLOVE) IMPLANT
GOWN STRL REUS W/ TWL LRG LVL3 (GOWN DISPOSABLE) ×4 IMPLANT
GOWN STRL REUS W/TWL LRG LVL3 (GOWN DISPOSABLE) ×2
KIT BASIN OR (CUSTOM PROCEDURE TRAY) ×2 IMPLANT
KIT TURNOVER KIT B (KITS) ×2 IMPLANT
MARKER SKIN DUAL TIP RULER LAB (MISCELLANEOUS) ×2 IMPLANT
NDL HYPO 25GX1X1/2 BEV (NEEDLE) ×2 IMPLANT
NDL SPNL 18GX3.5 QUINCKE PK (NEEDLE) ×2 IMPLANT
NEEDLE HYPO 25GX1X1/2 BEV (NEEDLE) ×1 IMPLANT
NEEDLE SPNL 18GX3.5 QUINCKE PK (NEEDLE) ×1 IMPLANT
PACK GENERAL/GYN (CUSTOM PROCEDURE TRAY) ×2 IMPLANT
PACK UNIVERSAL I (CUSTOM PROCEDURE TRAY) ×2 IMPLANT
PAD ARMBOARD 7.5X6 YLW CONV (MISCELLANEOUS) ×4 IMPLANT
PENCIL BUTTON HOLSTER BLD 10FT (ELECTRODE) ×2 IMPLANT
PIN SAFETY STERILE (MISCELLANEOUS) ×2 IMPLANT
SPONGE T-LAP 18X18 ~~LOC~~+RFID (SPONGE) IMPLANT
STAPLER VISISTAT 35W (STAPLE) ×4 IMPLANT
STRIP CLOSURE SKIN 1/2X4 (GAUZE/BANDAGES/DRESSINGS) ×2 IMPLANT
SUT ETHILON 3 0 PS 1 (SUTURE) ×2 IMPLANT
SUT MNCRL AB 3-0 PS2 18 (SUTURE) ×8 IMPLANT
SUT MNCRL AB 4-0 PS2 18 (SUTURE) ×8 IMPLANT
SUT PROLENE 2 0 CT2 30 (SUTURE) ×2 IMPLANT
SUT PROLENE 3 0 PS 1 (SUTURE) ×2 IMPLANT
SUT VLOC 90 P-14 23 (SUTURE) ×4 IMPLANT
SYR BULB IRRIG 60ML STRL (SYRINGE) ×2 IMPLANT
SYR CONTROL 10ML LL (SYRINGE) ×2 IMPLANT
TAPE MEASURE VINYL STERILE (MISCELLANEOUS) ×2 IMPLANT
TRAY FOLEY W/BAG SLVR 16FR (SET/KITS/TRAYS/PACK)
TRAY FOLEY W/BAG SLVR 16FR ST (SET/KITS/TRAYS/PACK) IMPLANT
TUBE CONNECTING 12X1/4 (SUCTIONS) IMPLANT

## 2022-05-30 NOTE — Transfer of Care (Signed)
Immediate Anesthesia Transfer of Care Note  Patient: Rebekah [name]  Procedure(s) Performed: MAMMARY REDUCTION  (BREAST) (Bilateral)  Patient Location: PACU  Anesthesia Type:General  Level of Consciousness: drowsy and patient cooperative  Airway & Oxygen Therapy: Patient Spontanous Breathing and Patient connected to nasal cannula oxygen  Post-op Assessment: Report given to RN, Post -op Vital signs reviewed and stable, and Patient moving all extremities X 4  Post vital signs: Reviewed and stable  Last Vitals:  Vitals Value Taken Time  BP 107/68   Temp    Pulse 58 05/30/22 1244  Resp 14 05/30/22 1244  SpO2 100 % 05/30/22 1244  Vitals shown include unvalidated device data.  Last Pain:  Vitals:   05/30/22 0608  PainSc: 5          Complications: No notable events documented.

## 2022-05-30 NOTE — Discharge Instructions (Signed)
1. No lifting greater than 5 lbs with arms for 4 weeks. 2. Empty, strip, record and reactivate JP drains 3 times a day. 3. Percocet 5/325 mg tabs 1-2 tabs po q 4-6 hours prn pain- prescription given in office. 4. Duricef 1 tab po bid- prescription given in office. 5. Sterapred dose pack as directed- prescription given in office. 6. Follow-up appointment Friday in office.   

## 2022-05-30 NOTE — Progress Notes (Signed)
Pt states that she took Hydrochlorothiazide  12.5 mg this morning. Dr. Malen Gauze is aware.

## 2022-05-30 NOTE — H&P (Signed)
  H&P faxed to surgical center.  -History and Physical Reviewed  -Patient has been re-examined  -No change in the plan of care  Rebekah [name]    

## 2022-05-30 NOTE — Brief Op Note (Signed)
05/30/2022  12:39 PM  PATIENT:  Rebekah [name]  52 y.o. female  PRE-OPERATIVE DIAGNOSIS:  BILATERAL HYPERTROPHY OF THE BREAST  POST-OPERATIVE DIAGNOSIS:  BILATERAL HYPERTROPHY OF THE BREAST  PROCEDURE:  Procedure(s): MAMMARY REDUCTION  (BREAST) (Bilateral)  SURGEON:  Surgeon(s) and Role:    * Oluwatimileyin Vivier, Chales Abrahams, MD - Primary  ASSISTANTS: Marge abanto-walston, RNFA  ANESTHESIA:   general  EBL:  100 mL   BLOOD ADMINISTERED:none  DRAINS: (80F) Jackson-Pratt drain(s) with closed bulb suction in the Bilateral Breasts    LOCAL MEDICATIONS USED:  1.3% Exparel (total 266 mgs.)  SPECIMEN:  Bilateral Breasts  DISPOSITION OF SPECIMEN:  PATHOLOGY  COUNTS:  YES  DICTATION: .Note written in EPIC  PLAN OF CARE: Discharge to home after PACU  PATIENT DISPOSITION:  PACU - hemodynamically stable.   Delay start of Pharmacological VTE agent (>24hrs) due to surgical blood loss or risk of bleeding: not applicable

## 2022-05-30 NOTE — Anesthesia Postprocedure Evaluation (Signed)
Anesthesia Post Note  Patient: Rebekah [name]  Procedure(s) Performed: MAMMARY REDUCTION  (BREAST) (Bilateral)     Patient location during evaluation: PACU Anesthesia Type: General Level of consciousness: awake and alert and oriented Pain management: pain level controlled Vital Signs Assessment: post-procedure vital signs reviewed and stable Respiratory status: spontaneous breathing, nonlabored ventilation and respiratory function stable Cardiovascular status: blood pressure returned to baseline and stable Postop Assessment: no apparent nausea or vomiting Anesthetic complications: no   No notable events documented.  Last Vitals:  Vitals:   05/30/22 1258 05/30/22 1313  BP: 112/75 111/68  Pulse: (!) 53 65  Resp: 11 10  Temp:    SpO2: 99% 98%    Last Pain:  Vitals:   05/30/22 1313  PainSc: 3                  Cheyenne Schumm A.

## 2022-05-30 NOTE — Op Note (Signed)
OPERATIVE REPORT  05/30/2022  Rebekah [name]  PREOPERATIVE DIAGNOSIS:  Bilateral macromastia.  POSTOPERATIVE DIAGNOSIS:  Bilateral macromastia.  PROCEDURE:  Bilateral reduction mammoplasties.  ATTENDING SURGEON:  Eloise Levels, MD  ASSISTANT: Ronda Fairly, RNFA  ANESTHESIA:  General.  ANESTHESIOLOGIST:  M. Malen Gauze, MD  COMPLICATIONS:  None.  INDICATIONS FOR THE PROCEDURE:  The patient is a 52 y.o. female who has bilateral macromastia that is clinically symptomatic.  She presents to undergo bilateral reduction mammoplasties.  DESCRIPTION OF PROCEDURE:  The patient was marked in preop holding area in a pattern of Wise for the future bilateral reduction mammoplasties. She was then taken back to the OR, placed on the table in supine position.  After adequate general anesthesia was obtained, the patient's chest was prepped with Techni-Care and draped in sterile fashion.  The bases of the breasts have been infiltrated with 1% lidocaine with epinephrine.  After adequate hemostasis and anesthesia taken effect, the procedure was begun.  Both of the breast reductions were performed in the following similar manner.  The nipple-areolar complex was marked with a 45-mm nipple marker.  The skin was then incised and deepithelialized around the nipple-areolar complex down to the inframammary crease in the inferior pedicle pattern.  Next, the medial, superior, and lateral skin flaps were elevated down to the chest wall.  Excess fat and glandular tissue removed from the inferior pedicle.  The nipple-areolar complex was examined and found to be pink and viable.  The wound was irrigated with saline irrigation.  Meticulous hemostasis was obtained with the Bovie electrocautery.  Inferior pedicle was centralized using 3-0 Prolene suture.  A #10 JP flat fully fluted drain was placed into the wound. The skin flaps were brought together at the inverted T junction with a 2- 0  Prolene suture.  The incisions were stapled for temporary closure. The breasts compared and found to have good shape and symmetry.  The incisions were then closed from the medial aspect of the JP drain to the medial aspect of the [facility] incision by first placing a few 3-0 Monocryl sutures to tack together the dermal layer, and then both the dermal and cuticular layer were closed in a single layer using a 3-0 V-lock barbed suture.  Lateral to the JP drain incision was closed using 3-0 Monocryl in the dermal layer, followed by 3-0 Monocryl running intracuticular stitch on the skin.  The vertical limb of the Wise pattern was closed in the dermal layer using 3-0 Monocryl suture.  The patient was placed in the upright position.  The future location of the nipple-areolar complexes was marked on both breast mounds using the 42-mm nipple marker.  She was then placed back in the recumbent position.  Both of the nipple areolar complexes were brought out onto the breast mounds in the following similar manner.  The skin was incised as marked and removed in full thickness into the subcutaneous tissues.  The nipple- areolar complex was examined, found to be pink and viable, then brought out through this aperture and sewn in place using 4-0 Monocryl in the dermal layer, followed by 5-0 Monocryl running intracuticular stitch on the skin.  This 5-0 Monocryl suture was then brought down to close the cuticular layer of the vertical limb as well.  The JP drain was sewn in place using 3-0 nylon suture.  The pectoralis major muscle and fascia along with the breast and chest soft tissues were then infiltrated with 1% Exparel (total 266 mg).  Now the  IMC incision was also infiltrated with the Exparel in order to give the patient postoperative pain control.  The incisions were dressed with Steri-Strips, and the nipples dressed with bacitracin ointment and Adaptic.  4x4s were placed over the incisions and ABD pads  in the axillary areas.  The patient was placed into a light postoperative support bra.  There were no complications. The patient tolerated the procedure well.  The final needle, sponge counts were reported to be correct at the end of the case.  The patient was then recovered without complications.  Both the patient and her husband were given proper postoperative wound care instructions. She was then discharged home in the care of her husband in stable condition.  Follow up will be with me in a few days in the office.         Eloise Levels, M.D.  05/30/2022 12:35 PM

## 2022-05-30 NOTE — Anesthesia Procedure Notes (Signed)
Procedure Name: Intubation Date/Time: 05/30/2022 8:06 AM  Performed by: Alease Medina, CRNAPre-anesthesia Checklist: Patient identified, Emergency Drugs available, Suction available and Patient being monitored Patient Re-evaluated:Patient Re-evaluated prior to induction Oxygen Delivery Method: Circle system utilized Preoxygenation: Pre-oxygenation with 100% oxygen Induction Type: IV induction Ventilation: Mask ventilation without difficulty Laryngoscope Size: Mac and 3 Grade View: Grade I Tube type: Oral Tube size: 7.0 mm Number of attempts: 1 Airway Equipment and Method: Stylet and Oral airway Placement Confirmation: ETT inserted through vocal cords under direct vision, positive ETCO2 and breath sounds checked- equal and bilateral Secured at: 21 cm Tube secured with: Tape Dental Injury: Teeth and Oropharynx as per pre-operative assessment

## 2022-05-31 ENCOUNTER — Encounter (HOSPITAL_COMMUNITY): Payer: Self-pay | Admitting: Plastic Surgery

## 2022-05-31 LAB — SURGICAL PATHOLOGY

## 2022-12-01 IMAGING — CT CT HEAD W/O CM
4 series · 17 of 47 positions shown, 19 images · non-contrast
Comparison: None.

CLINICAL DATA: Headache

EXAM:
CT HEAD WITHOUT CONTRAST
TECHNIQUE: Contiguous axial images were obtained from the base of the skull
through the vertex without intravenous contrast.

[Series 2: head wo · axial · 0.43mm/px · z∈[-321,-216]mm · 7 of 29 slices shown, 9 images]
[im 4/29  brain]
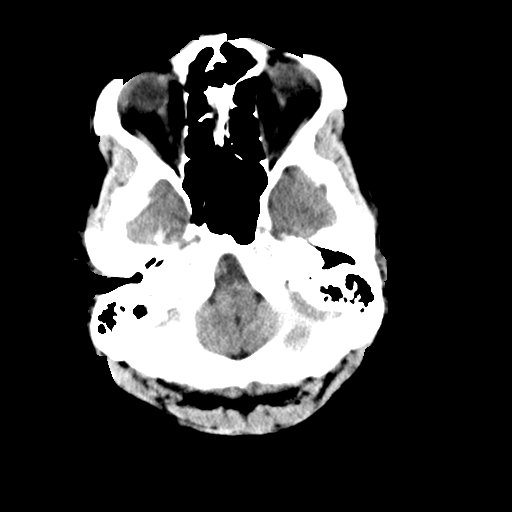
[im 4/29  bone]
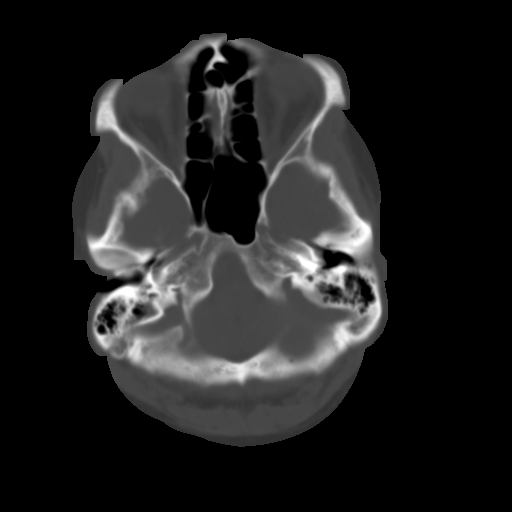
[im 8/29  brain]
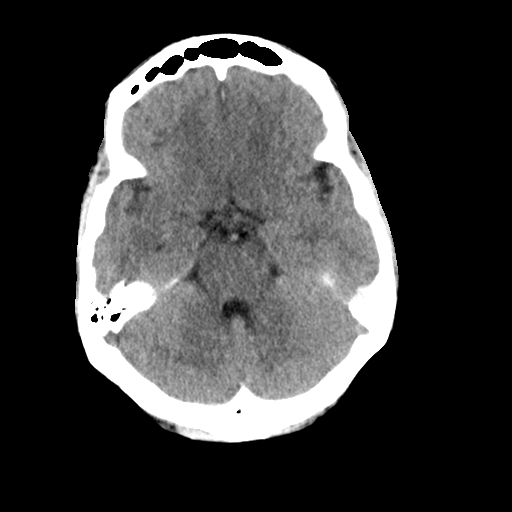
[im 11/29  brain]
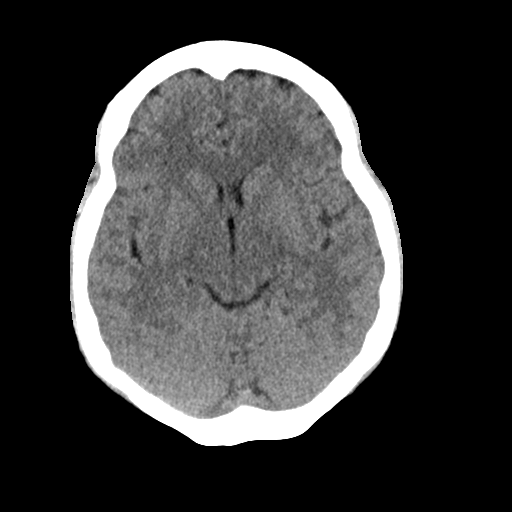
[im 15/29  brain]
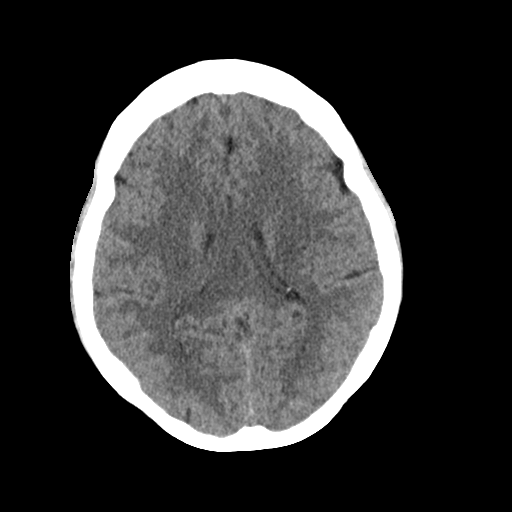
[im 18/29  brain]
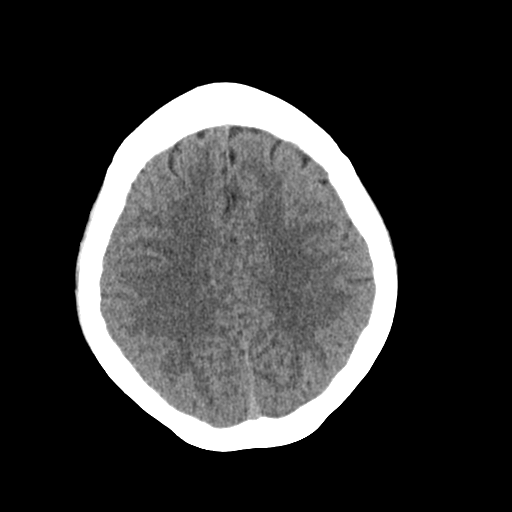
[im 18/29  bone]
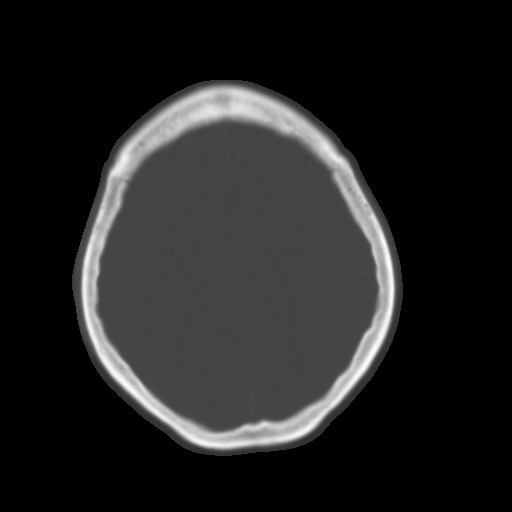
[im 22/29  brain]
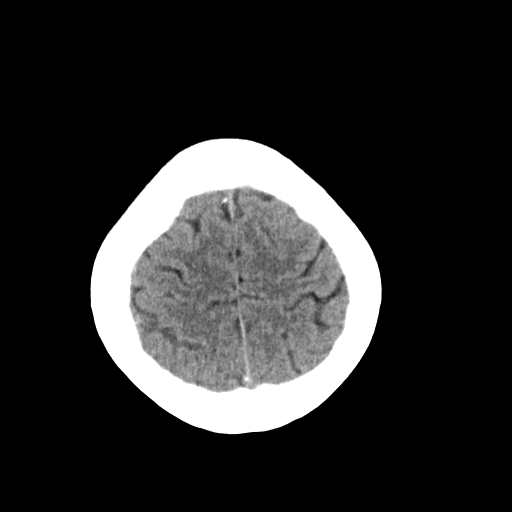
[im 25/29  brain]
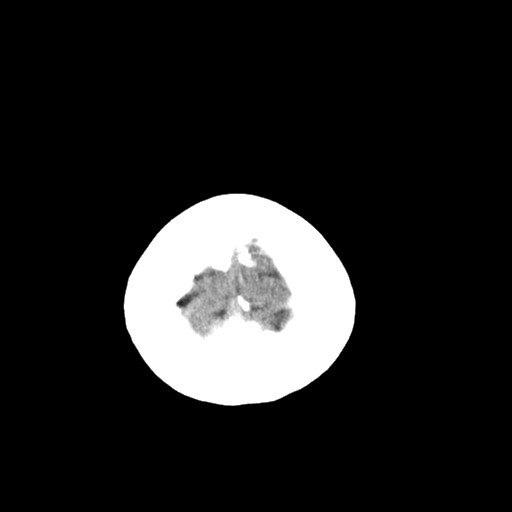

[Series 3: head bone · axial · 0.43mm/px · z∈[-322,-274]mm · 4 of 71 slices shown]
[im 8/71  bone]
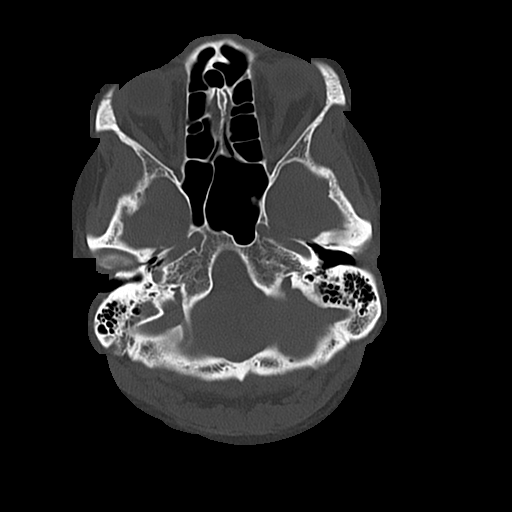
[im 15/71  bone]
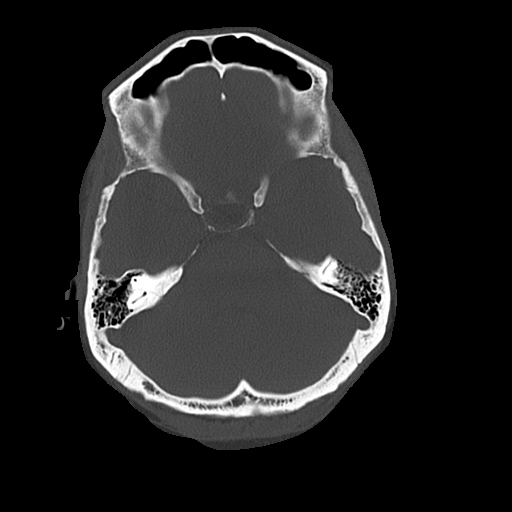
[im 22/71  bone]
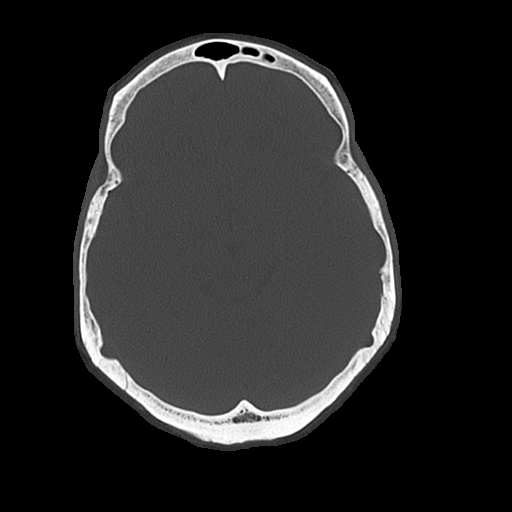
[im 32/71  bone]
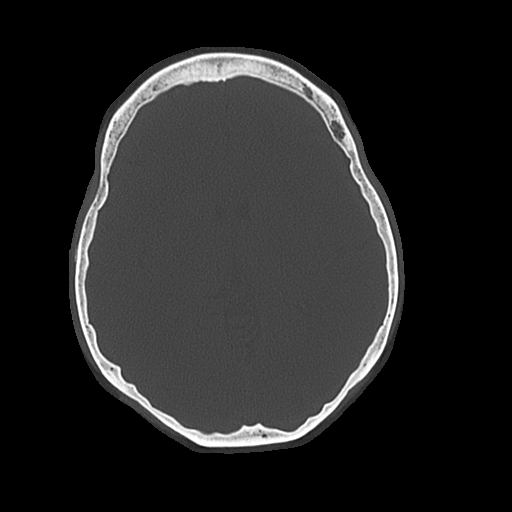

[Series 4: coronal soft · coronal · 0.33mm/px · 3 of 65 slices shown]
[im 22/65  brain]
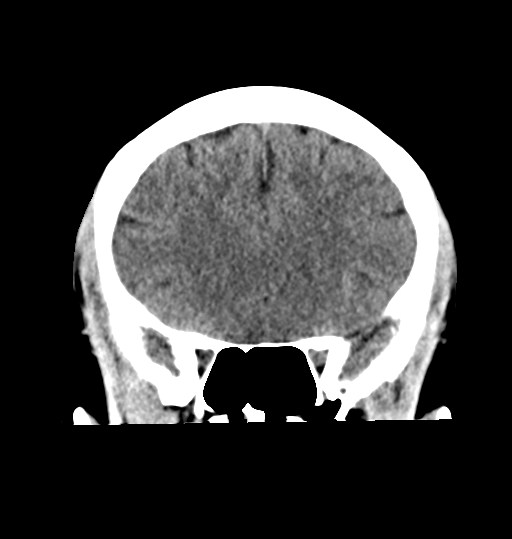
[im 29/65  brain]
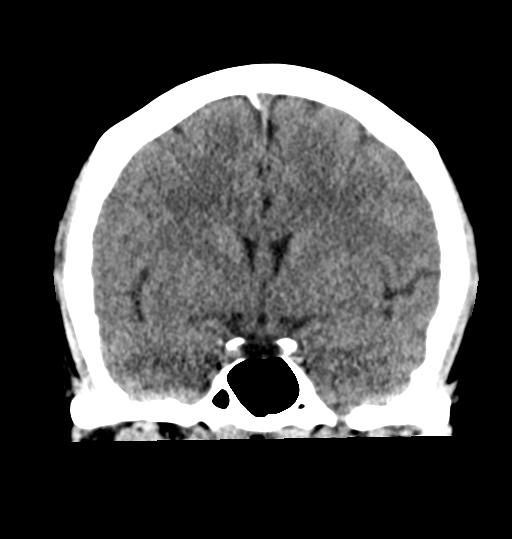
[im 36/65  brain]
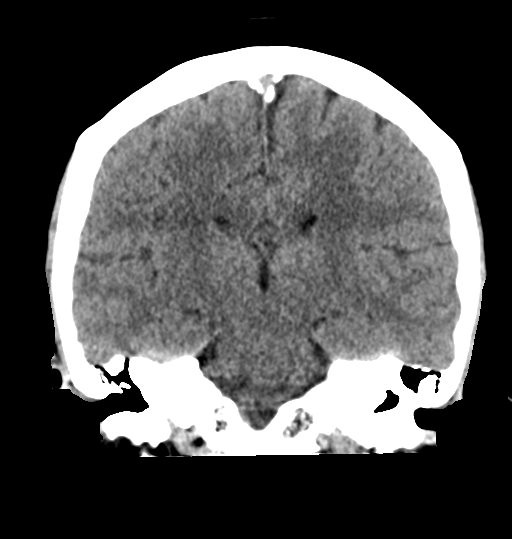

[Series 5: sagittal soft · sagittal · 0.35mm/px · 3 of 56 slices shown]
[im 19/56  brain]
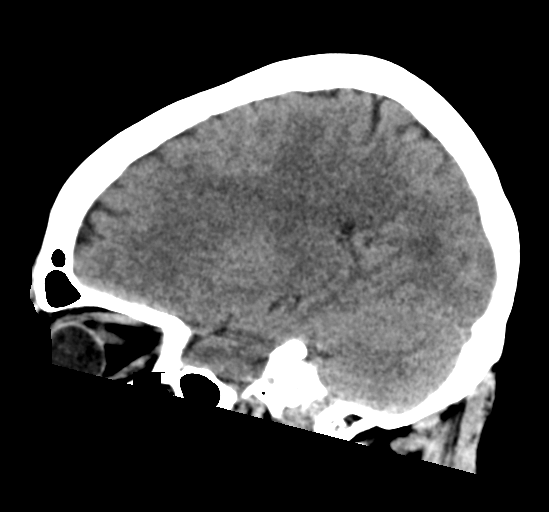
[im 28/56  brain]
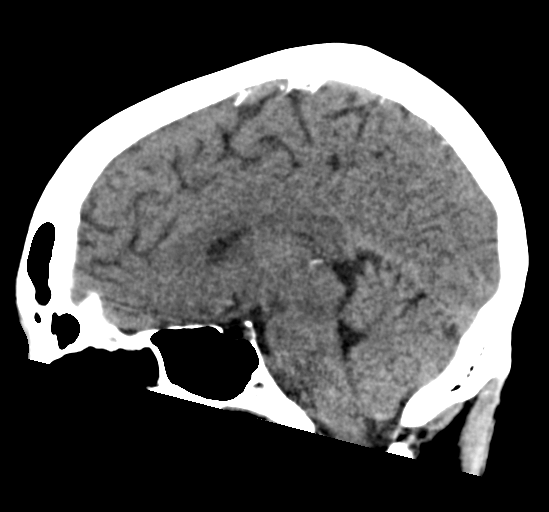
[im 37/56  brain]
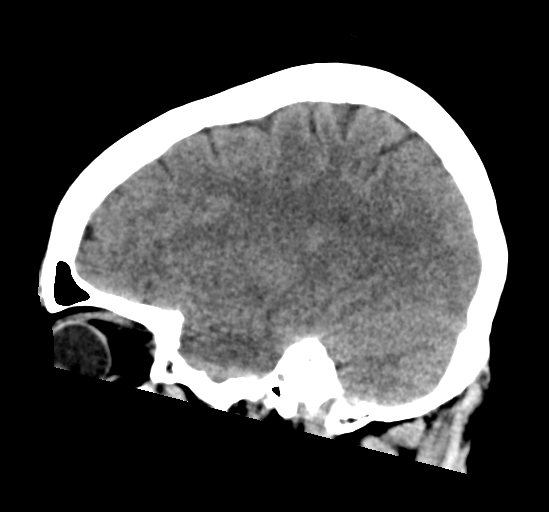

[17 of 47 positions shown; findings below may reference images not displayed]

FINDINGS: Brain: No evidence of acute infarction, hemorrhage, cerebral edema,
mass, mass effect, or midline shift. Ventricles and sulci are normal
for age. No extra-axial fluid collection.

Vascular: No hyperdense vessel or unexpected calcification.

Skull: Normal. Negative for fracture or focal lesion.

Sinuses/Orbits: No acute finding.

Other: The mastoid air cells are well aerated.
IMPRESSION: No acute intracranial process. No etiology seen for the patient's
headache.

## 2022-12-11 ENCOUNTER — Other Ambulatory Visit (HOSPITAL_COMMUNITY): Payer: Self-pay

## 2022-12-11 MED ORDER — SEMAGLUTIDE-WEIGHT MANAGEMENT 0.5 MG/0.5ML ~~LOC~~ SOAJ
0.5000 mg | SUBCUTANEOUS | 3 refills | Status: AC
  Filled 2022-12-11: qty 2, 28d supply, fill #0

## 2023-09-24 ENCOUNTER — Encounter: Payer: Self-pay | Admitting: Pediatrics

## 2023-10-08 NOTE — Progress Notes (Signed)
 " [facility] Initial Consultation   Referring Provider Larnell Hamilton, MD [address] [place],  [place] [address]  Primary Care Provider Lenon Nell SAILOR, FNP  Patient Profile: Rebekah [name] is a 53 y.o. female who is seen in consultation in the [facility] at the request of Dr. Larnell for evaluation and management of the problem(s) noted below.  Problem List: Bloating Constipation Colorectal cancer screening History of GERD History of cavernous hemangioma of the liver   History of Present Illness    Discussed the use of AI scribe software for clinical note transcription with the patient, who gave verbal consent to proceed.  History of Present Illness Rebekah [name] is a 53 year old female with a past medical history noteworthy for HTN, allergic rhinitis, community-acquired pneumonia, and prediabetes who presents to the office for evaluation and management of bloating, constipation and colorectal cancer screening  Abdominal bloating - Onset after breast reduction surgery on Jun 02, 2022 - Sudden abdominal swelling, predominantly on the left side, described as resembling pregnancy - No specific triggers related to meals or activities - Episodes are unpredictable in pattern and duration - No associated abdominal pain, nausea, or vomiting - Sensation of being gassy without significant passage of gas - No current abdominal pain  Constipation - Worsening over the past six months - Bowel movements as infrequent as once per week - Previously had irregular bowel movements, but intervals were not as prolonged - Questions if semaglutide  may be contributing to symptoms but notes she had tolerated prior to 6 months ago - Has not tried Miralax - Uses Align probiotic and has increased water and fruit intake to manage symptoms - No history of thyroid disease  Colorectal cancer screening - No prior colonoscopy - Cologuard - 08/24/2020 - Family  history of colorectal cancer polyps  History of GERD - Chart documents a history of GERD - Medication list includes a prior prescription for omeprazole  - Patient did not endorse having significant symptoms of GERD at today's visit  GI Review of Symptoms Significant for abdominal bloating and constipation. Otherwise negative.  General Review of Systems  Review of systems is significant for the pertinent positives and negatives as listed per the HPI.  Full ROS is otherwise negative.  Past Medical History   Past Medical History:  Diagnosis Date   Allergies    Anxiety    Arthritis    Hypertension      Past Surgical History   Past Surgical History:  Procedure Laterality Date   ABDOMINOPLASTY  2020   tummy tuck   BREAST REDUCTION SURGERY Bilateral 05/30/2022   Procedure: MAMMARY REDUCTION  (BREAST);  Surgeon: Contogiannis, Ronal Caldron, MD;  Location: [facility] OR;  Service: Plastics;  Laterality: Bilateral;   WISDOM TOOTH EXTRACTION       Allergies and Medications   Allergies  Allergen Reactions   Latex Rash    Current Meds  Medication Sig   [employer] VITAMIN D 2000 units CAPS Take 2,000 Units by mouth daily as needed (vitamin).   fluticasone (FLONASE) 50 MCG/ACT nasal spray Place 2 sprays into both nostrils daily.   Homeopathic Products (ARNICA [place]  PO) Take by mouth daily. Taper per surgeon- patient to take prior to and after surgery   hydrochlorothiazide  (MICROZIDE ) 12.5 MG capsule Take 1 capsule (12.5 mg total) by mouth daily.   HYDROcodone -acetaminophen  (NORCO/VICODIN) 5-325 MG tablet Take 1 tablet by mouth every 6 (six) hours as needed for severe pain.   Ipratropium-Albuterol  (COMBIVENT RESPIMAT) 20-100 MCG/ACT  AERS respimat Inhale 1 puff into the lungs every 6 (six) hours as needed for wheezing or shortness of breath (or coughing).   metoprolol  tartrate (LOPRESSOR ) 50 MG tablet Take 1 tablet (50 mg total) by mouth 2 (two) times daily.   montelukast (SINGULAIR) 10 MG tablet  Take 10 mg by mouth daily.   omeprazole  (PRILOSEC) 20 MG capsule Take 1 capsule (20 mg total) by mouth daily. (Patient taking differently: Take 20 mg by mouth daily as needed (stomach).)   Semaglutide -Weight Management 0.5 MG/0.5ML SOAJ Inject 0.5 mg into the skin once a week.     Family History   Family History  Problem Relation Age of Onset   Hypertension Mother    Hypertension Father    Diabetes Mellitus II Maternal Grandfather      Social History   Social History   Tobacco Use   Smoking status: Some Days    Current packs/day: 0.00    Average packs/day: 0.3 packs/day for 20.0 years (6.0 ttl pk-yrs)    Types: Cigarettes    Start date: 2003    Last attempt to quit: 2023    Years since quitting: 2.7   Smokeless tobacco: Never   Tobacco comments:    notes sometimes smokes one to two cigarettes daily  Vaping Use   Vaping status: Never Used  Substance Use Topics   Alcohol use: Yes    Comment: social   Drug use: Not Currently    Types: Marijuana    Comment: nightly PRN (small amount each night if needed)   Rebekah [name] reports that she has been smoking cigarettes. She started smoking about 22 years ago. She has a 6 pack-year smoking history. She has never used smokeless tobacco. She reports current alcohol use. She reports that she does not currently use drugs after having used the following drugs: Marijuana.  Vital Signs and Physical Examination   Vitals:   10/10/23 1105  BP: 128/78  Pulse: 89   Body mass index is 28.66 kg/m. Weight: 183 lb (83 kg)  General: Well developed, well nourished, no acute distress Head: Normocephalic and atraumatic Eyes: Sclerae anicteric, EOMI Lungs: Clear throughout to auscultation Heart: Regular rate and rhythm; No murmurs, rubs or bruits Abdomen: Soft, mildly distended, tender to palpation over left upper quadrant, no rebound or guarding no masses, hepatosplenomegaly or hernias noted. Normal Bowel sounds Rectal:  Deferred Musculoskeletal: Symmetrical with no gross deformities    Review of Data  The following data was reviewed at the time of this encounter:  Laboratory Studies   Labs 08/13/2023 WBC 4.8, Hgb 13, HCT 38.8, platelets 223 CMP normal TSH 1.89      Latest Ref Rng & Units 05/22/2022    8:35 AM 05/26/2021    7:40 AM 08/26/2020    7:59 PM  CBC  WBC 4.0 - 10.5 K/uL 5.3  8.1  8.5   Hemoglobin 12.0 - 15.0 g/dL 86.2  86.3  87.3   Hematocrit 36.0 - 46.0 % 42.6  41.5  39.0   Platelets 150 - 400 K/uL 275  286  224     Lab Results  Component Value Date   LIPASE 29 05/26/2021      Latest Ref Rng & Units 05/22/2022    8:35 AM 05/30/2021    3:21 PM 05/26/2021    7:40 AM  CMP  Glucose 70 - 99 mg/dL 90  90  98   BUN 6 - 20 mg/dL 14  16  17    Creatinine 0.44 -  1.00 mg/dL 8.98  9.03  8.96   Sodium 135 - 145 mmol/L 140  143  141   Potassium 3.5 - 5.1 mmol/L 3.4  3.8  3.3   Chloride 98 - 111 mmol/L 103  104  99   CO2 22 - 32 mmol/L 28  25  29    Calcium 8.9 - 10.3 mg/dL 8.9  9.1  9.9   Total Protein 6.5 - 8.1 g/dL   8.2   Total Bilirubin 0.3 - 1.2 mg/dL   0.7   Alkaline Phos 38 - 126 U/L   75   AST 15 - 41 U/L   23   ALT 0 - 44 U/L   12    08/24/2020 -Cologuard negative  Imaging Studies  MRI/MRCP 10/24/2015 The 12 mm lesion identified in the right liver on the recent CT scan represents cavernous hemangioma.  CT angio 10/23/2015  1. Bilateral patchy pulmonary opacities, particularly in the upper lobes, consistent with multi focal pneumonia versus aspiration. Recommend clinical correlation. Recommend follow-up to resolution. 2. No aortic aneurysm or dissection. 3. 1.7 cm nonspecific mass in the right hepatic lobe. This is most likely a benign etiology such as a complicated cyst or hemangioma. An MRI could further characterize to exclude neoplasm.  GI Procedures and Studies  None   Clinical Impression  It is my clinical impression that Rebekah [name] is a 53 y.o. female  with;  Bloating Constipation Colorectal cancer screening History of GERD History of cavernous hemangioma of the liver  Rebekah [name] presents to the office today primarily for evaluation and management of a a 1 year history of abdominal bloating and 107-month history of constipation.  She endorses symptoms of comfort and swelling over her left upper abdomen after breast reduction surgery.  She also reports worsening constipation over the last 6 months skipping days going to the bathroom.  She has never had a colonoscopy but did have a negative Cologuard in 2022.  Noteworthy that she is on semaglutide  and reviewed with her that this can exacerbate gastrointestinal motility that could be contributing to her symptoms.  Thyroid function checked within the last 2 months was normal.  Given her recent change in symptoms she is amenable to undergoing colonoscopy to rule out structural lesions.  SIBO may be another consideration.  Given that her symptoms developed after a surgical procedure I have also suggested performing cross-sectional imaging of her abdomen in the form of a CT scan of the abdomen pelvis.  Plan  Schedule colonoscopy at [facility] with 2-day bowel prep Schedule CT scan of the abdomen and pelvis with contrast for evaluation of abdominal discomfort, left-sided bloating and constipation Reviewed importance of fiber in diet and hydration Start daily low-dose MiraLAX 1/4-1/2 cap daily; if ineffective may require stimulant laxatives Continue align probiotic If symptoms of gas and discomfort evolve can trial simethicone  and Beano If above workup is unremarkable consider SIBO testing  Planned Follow Up TBD pending CT and colonoscopy results  The patient or caregiver verbalized understanding of the material covered, with no barriers to understanding. All questions were answered. Patient or caregiver is agreeable with the plan outlined above.    It was a pleasure to see Rebekah [name].  If you have any  questions or concerns regarding this evaluation, do not hesitate to contact me.  Inocente Hausen, MD [facility]  "

## 2023-10-10 ENCOUNTER — Ambulatory Visit: Admitting: Pediatrics

## 2023-10-10 ENCOUNTER — Encounter: Payer: Self-pay | Admitting: Pediatrics

## 2023-10-10 VITALS — BP 128/78 | HR 89 | Ht 67.0 in | Wt 183.0 lb

## 2023-10-10 DIAGNOSIS — R140 Abdominal distension (gaseous): Secondary | ICD-10-CM

## 2023-10-10 DIAGNOSIS — K5900 Constipation, unspecified: Secondary | ICD-10-CM

## 2023-10-10 DIAGNOSIS — Z1211 Encounter for screening for malignant neoplasm of colon: Secondary | ICD-10-CM | POA: Diagnosis not present

## 2023-10-10 DIAGNOSIS — R109 Unspecified abdominal pain: Secondary | ICD-10-CM | POA: Diagnosis not present

## 2023-10-10 NOTE — Patient Instructions (Signed)
 You have been scheduled for a CT scan of the abdomen and pelvis at [facility], 1st floor Radiology. You are scheduled on Monday, 10/22/23 at 3:30 pm. You should arrive 15 minutes prior to your appointment time for registration.     You may take any medications as prescribed with a small amount of water, if necessary. If you take any of the following medications: METFORMIN, GLUCOPHAGE, GLUCOVANCE, AVANDAMET, RIOMET, FORTAMET, ACTOPLUS MET, JANUMET, GLUMETZA or METAGLIP, you MAY be asked to HOLD this medication 48 hours AFTER the exam.   The purpose of you drinking the oral contrast is to aid in the visualization of your intestinal tract. The contrast solution may cause some diarrhea. Depending on your individual set of symptoms, you may also receive an intravenous injection of x-ray contrast/dye. Plan on being at [facility] for 45 minutes or longer, depending on the type of exam you are having performed.   If you have any questions regarding your exam or if you need to reschedule, you may call [facility] at [phone] between the hours of 8:00 am and 5:00 pm, Monday-Friday.     It has been recommended to you by your physician that you have a colonoscopy completed. Per your request, we did not schedule the procedure today. Please contact our office at [phone] should you decide to have the procedure completed. You will be scheduled for a pre-visit and procedure at that time.  Start a fiber supplement such as Benefiber, Citrucel, or  Metamucil.  Take 1 tablespoon once or twice daily can be used to keep bowels regular if needed.   Thank you for entrusting me with your care and for choosing [facility], Dr. Inocente Hausen  _______________________________________________________  If your blood pressure at your visit was 140/90 or greater, please contact your primary care physician to follow up on this.  _______________________________________________________  If  you are age 25 or older, your body mass index should be between 23-30. Your Body mass index is 28.66 kg/m. If this is out of the aforementioned range listed, please consider follow up with your Primary Care Provider.  If you are age 64 or younger, your body mass index should be between 19-25. Your Body mass index is 28.66 kg/m. If this is out of the aformentioned range listed, please consider follow up with your Primary Care Provider.   ________________________________________________________  The [facility] providers would like to encourage you to use MYCHART to communicate with providers for non-urgent requests or questions.  Due to long hold times on the telephone, sending your provider a message by [facility] may be a faster and more efficient way to get a response.  Please allow 48 business hours for a response.  Please remember that this is for non-urgent requests.  _______________________________________________________  [facility] is using a team-based approach to care.  Your team is made up of your doctor and two to three APPS. Our APPS (Nurse Practitioners and Physician Assistants) work with your physician to ensure care continuity for you. They are fully qualified to address your health concerns and develop a treatment plan. They communicate directly with your gastroenterologist to care for you. Seeing the Advanced Practice Practitioners on your physician's team can help you by facilitating care more promptly, often allowing for earlier appointments, access to diagnostic testing, procedures, and other specialty referrals.

## 2023-10-15 ENCOUNTER — Telehealth: Payer: Self-pay

## 2023-10-15 DIAGNOSIS — R109 Unspecified abdominal pain: Secondary | ICD-10-CM

## 2023-10-15 DIAGNOSIS — K5900 Constipation, unspecified: Secondary | ICD-10-CM

## 2023-10-15 DIAGNOSIS — R140 Abdominal distension (gaseous): Secondary | ICD-10-CM

## 2023-10-15 MED ORDER — NA SULFATE-K SULFATE-MG SULF 17.5-3.13-1.6 GM/177ML PO SOLN: 1.0000 | Freq: Once | ORAL | 0 refills | Status: AC

## 2023-10-15 NOTE — Telephone Encounter (Signed)
 Patient's daughter advised that her mom wanted to be scheduled for 12/10/23 @ 8:30 am for her colonoscopy with Dr. Suzann.  And of possible her pre visit with the nurse on 11/26/23 in the morning.  Both visits scheduled and instructions mailed to the patient today.

## 2023-10-22 ENCOUNTER — Ambulatory Visit (HOSPITAL_COMMUNITY)

## 2023-11-12 ENCOUNTER — Encounter: Payer: Self-pay | Admitting: Radiology

## 2023-11-15 ENCOUNTER — Telehealth: Payer: Self-pay

## 2023-11-15 NOTE — Telephone Encounter (Signed)
 Spoke with patient's daughter [place]. Patient would like to cancel CT. CT has been cancelled.

## 2023-11-19 ENCOUNTER — Ambulatory Visit (HOSPITAL_COMMUNITY)

## 2023-11-26 ENCOUNTER — Encounter

## 2023-11-26 ENCOUNTER — Telehealth: Payer: Self-pay

## 2023-11-26 NOTE — Telephone Encounter (Signed)
 Patient No Showed Pre visit.  No call back to reschedule pre visit. Pre visit and colonoscopy were cancelled and letter sent in My Chart and through mail.

## 2023-12-10 ENCOUNTER — Encounter: Admitting: Pediatrics

## 2023-12-12 ENCOUNTER — Ambulatory Visit: Admitting: Physician Assistant

## 2023-12-12 ENCOUNTER — Other Ambulatory Visit: Payer: Self-pay | Admitting: Radiology

## 2023-12-12 ENCOUNTER — Other Ambulatory Visit: Payer: Self-pay

## 2023-12-12 DIAGNOSIS — M5441 Lumbago with sciatica, right side: Secondary | ICD-10-CM

## 2023-12-12 MED ORDER — CYCLOBENZAPRINE HCL 10 MG PO TABS: 10.0000 mg | ORAL_TABLET | Freq: Every day | ORAL | 0 refills | Status: AC

## 2023-12-12 NOTE — Progress Notes (Signed)
 Office Visit Note   Patient: Rebekah [name]           Date of Birth: Apr 16, 1970           MRN: [id number] Visit Date: 12/12/2023              Requested by: Lenon Nell SAILOR, FNP [address] [name],  [place] [address] PCP: Lenon Nell SAILOR, FNP   Assessment & Plan: Visit Diagnoses:  1. Low back pain with right-sided sciatica, unspecified back pain laterality, unspecified chronicity     Plan: Will try to gain approval for an epidural steroid injection of the lumbar spine for her right sided radiculopathy.  She has failed conservative treatment which is included medications and time.  Given MRI did show an L4-5 right foraminal disc protrusion along with marginal osteophytes resulting in severe right neural foraminal stenosis.  Follow-Up Instructions: No follow-ups on file.   Orders:  Orders Placed This Encounter  Procedures   XR Lumbar Spine 2-3 Views   Meds ordered this encounter  Medications   cyclobenzaprine (FLEXERIL) 10 MG tablet    Sig: Take 1 tablet (10 mg total) by mouth at bedtime.    Dispense:  30 tablet    Refill:  0      Procedures: No procedures performed   Clinical Data: No additional findings.   Subjective: Chief Complaint  Patient presents with   Lower Back - Pain   Right Hip - Pain    HPI Rebekah [name] 53 year old female were seen for the first time for right leg pain.  This been ongoing for at least 3 months.  She seen her primary care physician gave her Medrol Dosepak and she was better while she was on the Medrol.  She also has taken some Flexeril at night which has helped some though for the rest.  She has had no formal therapy.  She is having pain that starts in her buttocks region that she describes as a tingling radiates down into her foot.  All of her pain started whenever she was stretching and felt a pop in her lower back this is back in September.  She does state that the back pain awakens her.  No loss of bowel or bladder  function, saddle anesthesia, fevers chills or weight loss.  She has also tried Tiger balm and ibuprofen without any relief.  She has had no formal therapy.  She does come in today to have MRI dated 10/09/2023 that was done at [facility] unable to see the images however the read shows L2-L3 disc desiccation with a small disc bulge.  No stenosis at this level.  L3: No significant disc herniation spinal canal or neuroforaminal compromise.  L4-5 disc desiccation with disc bulge superimposed right foraminal protrusion noted.  Marginal osteophytes appreciated.  Spinal canal is patent severe right neuroforaminal stenosis and the left neural foramina is mildly narrowed.  L5-S1 no significant disc herniation spinal canal or neuroforaminal compromise.  Review of Systems  Constitutional:  Negative for chills and fever.  Musculoskeletal:  Positive for back pain.     Objective: Vital Signs: LMP 06/25/2011   Physical Exam Constitutional:      Appearance: She is not ill-appearing or diaphoretic.  Cardiovascular:     Pulses: Normal pulses.  Neurological:     Mental Status: She is alert and oriented to person, place, and time.  Psychiatric:        Mood and Affect: Mood normal.  Ortho Exam Lower extremities 5 out of 5 strength throughout except for the right great toe which is 4 out of 5 with extension against resistance.  Positive straight leg raise on the right.  Able to perform tandem gait heel walking and toe walking.  Tight hamstrings bilaterally.  Positive straight leg raise on the right. Specialty Comments:  No specialty comments available.  Imaging: XR Lumbar Spine 2-3 Views Result Date: 12/12/2023 Lumbar spine: 2 views shows loss of lordotic curvature.  Disc space overall well-maintained.  No acute fractures.  No spondylolisthesis.    PMFS History: Patient Active Problem List   Diagnosis Date Noted   Cavernous hemangioma of liver 10/25/2015   SIRS (systemic inflammatory response syndrome)  (HCC)    Hypertension 10/23/2015   Anxiety 10/23/2015   Hypokalemia 10/23/2015   Abnormal EKG 10/23/2015   Nausea and vomiting 10/23/2015   Sinus infection 07/25/2011   Tooth pain 07/25/2011   ANEMIA-IRON DEFICIENCY 04/19/2009   ALLERGIC RHINITIS 04/19/2009   GERD 04/19/2009   HOT FLASHES 04/19/2009   WRIST PAIN, RIGHT, CHRONIC 04/19/2009   KNEE PAIN, BILATERAL 04/19/2009   BACK PAIN 04/19/2009   Past Medical History:  Diagnosis Date   Allergies    Anxiety    Arthritis    Hypertension     Family History  Problem Relation Age of Onset   Hypertension Mother    Hypertension Father    Diabetes Mellitus II Maternal Grandfather     Past Surgical History:  Procedure Laterality Date   ABDOMINOPLASTY  2020   tummy tuck   BREAST REDUCTION SURGERY Bilateral 05/30/2022   Procedure: MAMMARY REDUCTION  (BREAST);  Surgeon: Contogiannis, Ronal Caldron, MD;  Location: [facility] OR;  Service: Plastics;  Laterality: Bilateral;   WISDOM TOOTH EXTRACTION     Social History   Occupational History   Not on file  Tobacco Use   Smoking status: Some Days    Current packs/day: 0.00    Average packs/day: 0.3 packs/day for 20.0 years (6.0 ttl pk-yrs)    Types: Cigarettes    Start date: 2003    Last attempt to quit: 2023    Years since quitting: 2.9   Smokeless tobacco: Never   Tobacco comments:    notes sometimes smokes one to two cigarettes daily  Vaping Use   Vaping status: Never Used  Substance and Sexual Activity   Alcohol use: Yes    Comment: social   Drug use: Not Currently    Types: Marijuana    Comment: nightly PRN (small amount each night if needed)   Sexual activity: Yes    Partners: Male

## 2023-12-28 ENCOUNTER — Encounter: Payer: Self-pay | Admitting: Physician Assistant

## 2024-01-01 NOTE — Discharge Instructions (Addendum)

## 2024-01-04 ENCOUNTER — Ambulatory Visit
Admission: RE | Admit: 2024-01-04 | Discharge: 2024-01-04 | Disposition: A | Discharge status: Home / Self Care | Source: Ambulatory Visit | Attending: Physician Assistant | Admitting: Physician Assistant

## 2024-01-04 DIAGNOSIS — M5441 Lumbago with sciatica, right side: Secondary | ICD-10-CM

## 2024-01-04 MED ORDER — METHYLPREDNISOLONE ACETATE 40 MG/ML INJ SUSP (RADIOLOG
80.0000 mg | Freq: Once | INTRAMUSCULAR | Status: AC
  Administered 2024-01-04: 80 mg via EPIDURAL

## 2024-01-04 MED ORDER — IOPAMIDOL (ISOVUE-M 200) INJECTION 41%
1.0000 mL | Freq: Once | INTRAMUSCULAR | Status: AC
  Administered 2024-01-04: 1 mL via EPIDURAL

## 2024-01-21 ENCOUNTER — Telehealth: Payer: Self-pay | Admitting: *Deleted

## 2024-01-21 ENCOUNTER — Ambulatory Visit: Admitting: *Deleted

## 2024-01-21 VITALS — Ht 67.5 in | Wt 182.0 lb

## 2024-01-21 DIAGNOSIS — R109 Unspecified abdominal pain: Secondary | ICD-10-CM

## 2024-01-21 DIAGNOSIS — K5900 Constipation, unspecified: Secondary | ICD-10-CM

## 2024-01-21 DIAGNOSIS — R140 Abdominal distension (gaseous): Secondary | ICD-10-CM

## 2024-01-21 DIAGNOSIS — Z1211 Encounter for screening for malignant neoplasm of colon: Secondary | ICD-10-CM

## 2024-01-21 MED ORDER — NA SULFATE-K SULFATE-MG SULF 17.5-3.13-1.6 GM/177ML PO SOLN: 1.0000 | Freq: Once | ORAL | 0 refills | Status: AC

## 2024-01-21 NOTE — Progress Notes (Signed)
 Pt's name and DOB verified at the beginning of the pre-visit with 2 identifiers  Pt denies any difficulty with ambulating,sitting, laying down or rolling side to side  Pt has no issues moving head neck or swallowing  No egg or soy allergy known to patient   No issues known to pt with past sedation  No FH of Malignant Hyperthermia  Pt is not on home 02   Pt is not on blood thinners   Pt denies issues with constipation   Pt has frequent issues with constipation RN instructed pt to use Miralax per bottles instructions a week before prep days. Pt states they will  Pt is not on dialysis  Pt denise any abnormal heart rhythms   Pt denies any upcoming cardiac testing  Patient's chart reviewed by Norleen Schillings CNRA prior to pre-visit and patient appropriate for the [facility].  Pre-visit completed and red dot placed by patient's name on their procedure day (on provider's schedule).    Visit by phone  Pt states weight is 182 lb  Pt given  both [facility] main # and MD on call # prior to instructions.  Informed pt to come in at the time discussed and is shown on PV instructions.  Pt instructed to use Singlecare.com or GoodRx for a price reduction on prep  Instructed pt where to find PV instructions in My Chart. . Instructed pt on all aspects of written instructions including med holds clothing to wear and foods to eat and not eat as well as after procedure legal restrictions and to call MD on call if needed.. Pt states understanding. Instructed pt to review instructions again prior to procedure and call main # given if has any questions or any issues. Pt states they will. Instructed to not use Marijuana day before and day of procedure. Pt states she understand

## 2024-01-21 NOTE — Telephone Encounter (Signed)
 Attempt to reach pt for PV due to video not working. LM. Will attempt to connect for virtual visit. Not able to connect with pt via video. Reached pt and started PV. Pt requests that RN call her later due to her not being done with a breast exam visit. RN to call pt back at 9:30 am

## 2024-01-25 ENCOUNTER — Encounter: Payer: Self-pay | Admitting: Pediatrics

## 2024-01-28 ENCOUNTER — Encounter: Payer: Self-pay | Admitting: Gastroenterology

## 2024-01-28 ENCOUNTER — Ambulatory Visit: Admitting: Gastroenterology

## 2024-01-28 VITALS — BP 137/84 | HR 92 | Temp 97.8°F | Resp 15 | Ht 67.5 in | Wt 182.0 lb

## 2024-01-28 DIAGNOSIS — K5900 Constipation, unspecified: Secondary | ICD-10-CM

## 2024-01-28 DIAGNOSIS — R109 Unspecified abdominal pain: Secondary | ICD-10-CM

## 2024-01-28 DIAGNOSIS — R140 Abdominal distension (gaseous): Secondary | ICD-10-CM

## 2024-01-28 DIAGNOSIS — Z1211 Encounter for screening for malignant neoplasm of colon: Secondary | ICD-10-CM | POA: Diagnosis present

## 2024-01-28 MED ORDER — SODIUM CHLORIDE 0.9 % IV SOLN: 500.0000 mL | INTRAVENOUS | Status: DC

## 2024-01-28 NOTE — Op Note (Signed)
 [facility] Patient Name: Rebekah [name] Procedure Date: 01/28/2024 2:19 PM MRN: [id number] Endoscopist: Sandor Flatter , MD, [id number] Age: 54 Referring MD:  Date of Birth: 11/06/1970 Gender: Female Account #: [id number] Procedure:                Colonoscopy Indications:              Screening for colorectal malignant neoplasm                           Separately, history of constipation, but that has                            improved since office visit in October. Medicines:                Monitored Anesthesia Care Procedure:                Pre-Anesthesia Assessment:                           - Prior to the procedure, a History and Physical                            was performed, and patient medications and                            allergies were reviewed. The patient's tolerance of                            previous anesthesia was also reviewed. The risks                            and benefits of the procedure and the sedation                            options and risks were discussed with the patient.                            All questions were answered, and informed consent                            was obtained. Prior Anticoagulants: The patient has                            taken no anticoagulant or antiplatelet agents. ASA                            Grade Assessment: II - A patient with mild systemic                            disease. After reviewing the risks and benefits,                            the patient was deemed in satisfactory condition to  undergo the procedure.                           After obtaining informed consent, the colonoscope                            was passed under direct vision. Throughout the                            procedure, the patient's blood pressure, pulse, and                            oxygen saturations were monitored continuously. The                            Olympus Scope  [id number] was introduced through the                            anus and advanced to the the terminal ileum. The                            colonoscopy was performed without difficulty. The                            patient tolerated the procedure well. The quality                            of the bowel preparation was good. The terminal                            ileum, ileocecal valve, appendiceal orifice, and                            rectum were photographed. Scope In: 2:41:49 PM Scope Out: 2:55:40 PM Scope Withdrawal Time: 0 hours 10 minutes 54 seconds  Total Procedure Duration: 0 hours 13 minutes 51 seconds  Findings:                 The perianal and digital rectal examinations were                            normal.                           The entire colon appeared normal.                           The retroflexed view of the distal rectum and anal                            verge was normal and showed no anal or rectal                            abnormalities.  The terminal ileum appeared normal. Complications:            No immediate complications. Estimated Blood Loss:     Estimated blood loss: none. Impression:               - The entire examined colon is normal.                           - The distal rectum and anal verge are normal on                            retroflexion view.                           - The examined portion of the ileum was normal.                           - No specimens collected. Recommendation:           - Patient has a contact number available for                            emergencies. The signs and symptoms of potential                            delayed complications were discussed with the                            patient. Return to normal activities tomorrow.                            Written discharge instructions were provided to the                            patient.                           - Resume  previous diet.                           - Continue present medications.                           - Repeat colonoscopy in 10 years for screening                            purposes.                           - Return to GI office PRN. Sandor Flatter, MD 01/28/2024 3:02:17 PM

## 2024-01-28 NOTE — Patient Instructions (Signed)
 YOU HAD AN ENDOSCOPIC PROCEDURE TODAY AT THE [facility]:   Refer to the procedure report that was given to you for any specific questions about what was found during the examination.  If the procedure report does not answer your questions, please call your gastroenterologist to clarify.  If you requested that your care partner not be given the details of your procedure findings, then the procedure report has been included in a sealed envelope for you to review at your convenience later.  YOU SHOULD EXPECT: Some feelings of bloating in the abdomen. Passage of more gas than usual.  Walking can help get rid of the air that was put into your GI tract during the procedure and reduce the bloating. If you had a lower endoscopy (such as a colonoscopy or flexible sigmoidoscopy) you may notice spotting of blood in your stool or on the toilet paper. If you underwent a bowel prep for your procedure, you may not have a normal bowel movement for a few days.  Please Note:  You might notice some irritation and congestion in your nose or some drainage.  This is from the oxygen used during your procedure.  There is no need for concern and it should clear up in a day or so.  SYMPTOMS TO REPORT IMMEDIATELY:  Following lower endoscopy (colonoscopy or flexible sigmoidoscopy):  Excessive amounts of blood in the stool  Significant tenderness or worsening of abdominal pains  Swelling of the abdomen that is new, acute  Fever of 100F or higher  Resume previous diet Continue present medications Repeat colonoscopy in 10 years   For urgent or emergent issues, a gastroenterologist can be reached at any hour by calling [phone]. Do not use MyChart messaging for urgent concerns.    DIET:  We do recommend a small meal at first, but then you may proceed to your regular diet.  Drink plenty of fluids but you should avoid alcoholic beverages for 24 hours.  ACTIVITY:  You should plan to take it easy for the  rest of today and you should NOT DRIVE or use heavy machinery until tomorrow (because of the sedation medicines used during the test).    FOLLOW UP: Our staff will call the number listed on your records the next business day following your procedure.  We will call around 7:15- 8:00 am to check on you and address any questions or concerns that you may have regarding the information given to you following your procedure. If we do not reach you, we will leave a message.     If any biopsies were taken you will be contacted by phone or by letter within the next 1-3 weeks.  Please call us  at [phone] if you have not heard about the biopsies in 3 weeks.    SIGNATURES/CONFIDENTIALITY: You and/or your care partner have signed paperwork which will be entered into your electronic medical record.  These signatures attest to the fact that that the information above on your After Visit Summary has been reviewed and is understood.  Full responsibility of the confidentiality of this discharge information lies with you and/or your care-partner.

## 2024-01-28 NOTE — Progress Notes (Unsigned)
 Pulse ox moved to right ear due to press on nails

## 2024-01-28 NOTE — Progress Notes (Unsigned)
 Sedate, gd SR, tolerated procedure well, VSS, report to RN

## 2024-01-28 NOTE — Progress Notes (Signed)
 Pt's states no medical or surgical changes since previsit or office visit.

## 2024-01-28 NOTE — Progress Notes (Unsigned)
 "   GASTROENTEROLOGY PROCEDURE H&P NOTE   Primary Care Physician: Lenon Nell SAILOR, FNP    Reason for Procedure:  Colon Cancer screening  Plan:    Colonoscopy  Patient is appropriate for endoscopic procedure(s) in the ambulatory ([facility]) setting.  The nature of the procedure, as well as the risks, benefits, and alternatives were carefully and thoroughly reviewed with the patient. Ample time for discussion and questions allowed. The patient understood, was satisfied, and agreed to proceed. I personally addressed all patient questions and concerns.     HPI: Rebekah [name] is a 54 y.o. female who presents for colonoscopy for routine Colon Cancer screening.    Patient was seen by Dr. Suzann in the GI clinic on 10/10/2023 and scheduled for colonoscopy for routine CRC screening.  Additionally, history of constipation over the last 6 months.  Was started on MiraLAX.  She was scheduled for colonoscopy next week, but had a mixup on her schedule and thought the procedure was scheduled for today.  She completed a 2-day bowel prep and instead being worked in for colonoscopy today.  Otherwise no new GI symptoms or active issues today.  No known family history of colon cancer or related malignancy.    Past Medical History:  Diagnosis Date   Allergies    Anemia    Anxiety    Arthritis    Constipation    GERD (gastroesophageal reflux disease)    Hypertension     Past Surgical History:  Procedure Laterality Date   ABDOMINOPLASTY  2020   tummy tuck   BREAST REDUCTION SURGERY Bilateral 05/30/2022   Procedure: MAMMARY REDUCTION  (BREAST);  Surgeon: Contogiannis, Ronal Caldron, MD;  Location: [facility] OR;  Service: Plastics;  Laterality: Bilateral;   WISDOM TOOTH EXTRACTION      Prior to Admission medications  Medication Sig Start Date End Date Taking? Authorizing Provider  [employer] VITAMIN D 2000 units CAPS Take 2,000 Units by mouth daily as needed (vitamin). Patient not taking: Reported on 01/21/2024  09/22/15   [provider]  cyclobenzaprine  (FLEXERIL ) 10 MG tablet Take 1 tablet (10 mg total) by mouth at bedtime. Patient not taking: Reported on 01/21/2024 12/12/23   Gretta Bertrum ORN, PA-C  Homeopathic Products (ARNICA [place]  PO) Take by mouth daily. Taper per surgeon- patient to take prior to and after surgery Patient not taking: Reported on 01/21/2024    [provider]  hydrochlorothiazide  (MICROZIDE ) 12.5 MG capsule Take 1 capsule (12.5 mg total) by mouth daily. 05/31/21   Walker, Caitlin S, NP  HYDROcodone -acetaminophen  (NORCO/VICODIN) 5-325 MG tablet Take 1 tablet by mouth every 6 (six) hours as needed for severe pain. 05/26/21   Doretha Folks, MD  Ipratropium-Albuterol  (COMBIVENT RESPIMAT) 20-100 MCG/ACT AERS respimat Inhale 1 puff into the lungs every 6 (six) hours as needed for wheezing or shortness of breath (or coughing). 12/17/13   Raford Lenis, MD  metoprolol  tartrate (LOPRESSOR ) 50 MG tablet Take 1 tablet (50 mg total) by mouth 2 (two) times daily. 05/31/21   Walker, Caitlin S, NP  montelukast (SINGULAIR) 10 MG tablet Take 10 mg by mouth daily. Patient taking differently: Take 10 mg by mouth as needed. 04/28/22   [provider]  omeprazole  (PRILOSEC) 20 MG capsule Take 1 capsule (20 mg total) by mouth daily. Patient taking differently: Take 20 mg by mouth as needed (stomach). 05/26/21   Doretha Folks, MD  Probiotic Product (PROBIOTIC ADVANCED PO) Take by mouth daily at 12 noon.    [provider]  Semaglutide -Weight Management 0.5 MG/0.5ML SOAJ Inject 0.5 mg into the skin once a week. Patient not taking: Reported on 01/21/2024 12/04/22       Current Outpatient Medications  Medication Sig Dispense Refill   [employer] VITAMIN D 2000 units CAPS Take 2,000 Units by mouth daily as needed (vitamin). (Patient not taking: Reported on 01/21/2024)  3   cyclobenzaprine  (FLEXERIL ) 10 MG tablet Take 1 tablet (10 mg total) by mouth at bedtime. (Patient not taking:  Reported on 01/21/2024) 30 tablet 0   Homeopathic Products (ARNICA [place]  PO) Take by mouth daily. Taper per surgeon- patient to take prior to and after surgery (Patient not taking: Reported on 01/21/2024)     hydrochlorothiazide  (MICROZIDE ) 12.5 MG capsule Take 1 capsule (12.5 mg total) by mouth daily. 90 capsule 1   HYDROcodone -acetaminophen  (NORCO/VICODIN) 5-325 MG tablet Take 1 tablet by mouth every 6 (six) hours as needed for severe pain. 10 tablet 0   Ipratropium-Albuterol  (COMBIVENT RESPIMAT) 20-100 MCG/ACT AERS respimat Inhale 1 puff into the lungs every 6 (six) hours as needed for wheezing or shortness of breath (or coughing). 1 Inhaler 0   metoprolol  tartrate (LOPRESSOR ) 50 MG tablet Take 1 tablet (50 mg total) by mouth 2 (two) times daily. 180 tablet 1   montelukast (SINGULAIR) 10 MG tablet Take 10 mg by mouth daily. (Patient taking differently: Take 10 mg by mouth as needed.)     omeprazole  (PRILOSEC) 20 MG capsule Take 1 capsule (20 mg total) by mouth daily. (Patient taking differently: Take 20 mg by mouth as needed (stomach).) 14 capsule 0   Probiotic Product (PROBIOTIC ADVANCED PO) Take by mouth daily at 12 noon.     Semaglutide -Weight Management 0.5 MG/0.5ML SOAJ Inject 0.5 mg into the skin once a week. (Patient not taking: Reported on 01/21/2024) 2 mL 3   Current Facility-Administered Medications  Medication Dose Route Frequency Provider Last Rate Last Admin   0.9 %  sodium chloride  infusion  500 mL Intravenous Continuous McGreal, Inocente HERO, MD        Allergies as of 01/28/2024 - Review Complete 01/28/2024  Allergen Reaction Noted   Latex Rash 04/19/2009    Family History  Problem Relation Age of Onset   Hypertension Mother    Hypertension Father    Diabetes Mellitus II Maternal Grandfather    Colon cancer Neg Hx    Colon polyps Neg Hx    Esophageal cancer Neg Hx    Rectal cancer Neg Hx    Stomach cancer Neg Hx     Social History   Socioeconomic History   Marital  status: Married    Spouse name: Not on file   Number of children: 2   Years of education: Not on file   Highest education level: Not on file  Occupational History   Not on file  Tobacco Use   Smoking status: Some Days    Current packs/day: 0.00    Average packs/day: 0.3 packs/day for 20.0 years (6.0 ttl pk-yrs)    Types: Cigarettes    Start date: 2003    Last attempt to quit: 2023    Years since quitting: 3.0   Smokeless tobacco: Never   Tobacco comments:    notes sometimes smokes one to two cigarettes daily  Vaping Use   Vaping status: Never Used  Substance and Sexual Activity   Alcohol use: Yes    Comment: social   Drug use: Not Currently    Types: Marijuana    Comment: nightly PRN (small  amount each night if needed)   Sexual activity: Yes    Partners: Male  Other Topics Concern   Not on file  Social History Narrative   Not on file   Social Drivers of Health   Tobacco Use: High Risk (01/28/2024)   Patient History    Smoking Tobacco Use: Some Days    Smokeless Tobacco Use: Never    Passive Exposure: Not on file  Financial Resource Strain: Low Risk (05/17/2021)   Overall Financial Resource Strain (CARDIA)    Difficulty of Paying Living Expenses: Not hard at all  Food Insecurity: No Food Insecurity (05/17/2021)   Hunger Vital Sign    Worried About Running Out of Food in the Last Year: Never true    Ran Out of Food in the Last Year: Never true  Transportation Needs: No Transportation Needs (05/17/2021)   PRAPARE - Administrator, Civil Service (Medical): No    Lack of Transportation (Non-Medical): No  Physical Activity: Insufficiently Active (05/17/2021)   Exercise Vital Sign    Days of Exercise per Week: 2 days    Minutes of Exercise per Session: 40 min  Stress: Not on file  Social Connections: Unknown (06/20/2021)   [facility] from [facility]   Social Network    Social Network: Not on file  Intimate Partner Violence: Unknown (06/20/2021)   Received from  [facility]   HITS    Physically Hurt: Not on file    Insult or Talk Down To: Not on file    Threaten Physical Harm: Not on file    Scream or Curse: Not on file  Depression (EYV7-0): Not on file  Alcohol Screen: Low Risk (05/17/2021)   Alcohol Screen    Last Alcohol Screening Score (AUDIT): 1  Housing: Low Risk (05/17/2021)   Housing    Last Housing Risk Score: 0  Utilities: Not on file  Health Literacy: Not on file    Physical Exam: Vital signs in last 24 hours: @BP  (!) 145/96   Pulse 83   Temp 97.8 F (36.6 C) (Temporal)   Ht 5' 7.5 (1.715 m)   Wt 182 lb (82.6 kg)   LMP 06/25/2011   SpO2 97%   BMI 28.08 kg/m  GEN: NAD EYE: Sclerae anicteric ENT: MMM CV: Non-tachycardic Pulm: CTA b/l GI: Soft, NT/ND NEURO:  Alert & Oriented x 3   Sandor Flatter, DO [facility]   01/28/2024 2:23 PM  "

## 2024-01-29 ENCOUNTER — Telehealth: Payer: Self-pay | Admitting: *Deleted

## 2024-01-29 NOTE — Telephone Encounter (Signed)
 Post procedure follow up call placed, no answer and no VM.

## 2024-02-04 ENCOUNTER — Encounter: Admitting: Pediatrics
